# Patient Record
Sex: Male | Born: 1990 | Race: Black or African American | Hispanic: No | Marital: Single | Smoking: Current every day smoker
Health system: Southern US, Community
[De-identification: ages and names within clinical notes are randomized; demographics above are authoritative.]

## PROBLEM LIST (undated history)

## (undated) ENCOUNTER — Observation Stay (HOSPITAL_COMMUNITY): Admission: AD | Payer: Self-pay | Source: Intra-hospital | Admitting: Psychiatry

## (undated) ENCOUNTER — Emergency Department (HOSPITAL_COMMUNITY): Payer: Self-pay | Source: Home / Self Care

## (undated) DIAGNOSIS — I10 Essential (primary) hypertension: Secondary | ICD-10-CM

## (undated) DIAGNOSIS — J45909 Unspecified asthma, uncomplicated: Secondary | ICD-10-CM

---

## 2017-01-02 ENCOUNTER — Emergency Department (HOSPITAL_COMMUNITY)
Admission: EM | Admit: 2017-01-02 | Discharge: 2017-01-02 | Disposition: A | Discharge status: Home / Self Care | Payer: Self-pay | Attending: Emergency Medicine | Admitting: Emergency Medicine

## 2017-01-02 ENCOUNTER — Encounter (HOSPITAL_COMMUNITY): Payer: Self-pay

## 2017-01-02 DIAGNOSIS — K0889 Other specified disorders of teeth and supporting structures: Secondary | ICD-10-CM

## 2017-01-02 MED ORDER — DICLOFENAC SODIUM 50 MG PO TBEC: 50.0000 mg | DELAYED_RELEASE_TABLET | Freq: Two times a day (BID) | ORAL | 0 refills | Status: DC

## 2017-01-02 MED ORDER — AMOXICILLIN 500 MG PO CAPS: 500.0000 mg | ORAL_CAPSULE | Freq: Three times a day (TID) | ORAL | 0 refills | Status: DC

## 2017-01-02 NOTE — ED Triage Notes (Signed)
Patient complains of bottom left broken tooth and pain to gum and face.

## 2017-01-02 NOTE — ED Provider Notes (Signed)
[facility]-EMERGENCY DEPT Provider Note   CSN: [id number] Arrival date & time: 01/02/17  1219  By signing my name below, I, Majel HomerPeyton Lee, attest that this documentation has been prepared under the direction and in the presence of non-physician practitioner, Ok EdwardsLeslie Karen Sofia, PA-C. Electronically Signed: Majel HomerPeyton Lee, Scribe. 01/02/2017. 1:08 PM.  History   Chief Complaint No chief complaint on file.  The history is provided by the patient. No language interpreter was used.   HPI Comments: Bradley Reid is a 26 y.o. male who presents to the Emergency Department complaining of gradually worsening, left lower dental pain due to a "broken tooth" that began ~1 month ago. Pt reports associated gum and facial pain surrounding his broken tooth. He notes the last time he saw a dentist was "1-2 years ago." He denies any other complaints.   History reviewed. No pertinent past medical history.  There are no active problems to display for this patient.  History reviewed. No pertinent surgical history.  Home Medications    Prior to Admission medications   Not on File    Family History No family history on file.  Social History Social History  Substance Use Topics  . Smoking status: Not on file  . Smokeless tobacco: Not on file  . Alcohol use Not on file   Allergies   Patient has no known allergies.  Review of Systems Review of Systems  Constitutional: Negative for fever.  HENT: Positive for dental problem.    Physical Exam Updated Vital Signs BP 121/81   Pulse 62   Temp 98.2 F (36.8 C)   Resp 18   SpO2 100%   Physical Exam  Constitutional: He is oriented to person, place, and time. He appears well-developed and well-nourished.  HENT:  Head: Normocephalic.  Left lower premolar is broken, grayish discoloration with swollen gumline.   Eyes: EOM are normal.  Neck: Normal range of motion.  Pulmonary/Chest: Effort normal.  Abdominal: He exhibits no distension.  Musculoskeletal:  Normal range of motion.  Neurological: He is alert and oriented to person, place, and time.  Psychiatric: He has a normal mood and affect.  Nursing note and vitals reviewed.  ED Treatments / Results  DIAGNOSTIC STUDIES:  Oxygen Saturation is 100% on RA, normal by my interpretation.    COORDINATION OF CARE:  1:06 PM Discussed treatment plan with pt at bedside and pt agreed to plan.  Labs (all labs ordered are listed, but only abnormal results are displayed) Labs Reviewed - No data to display  EKG  EKG Interpretation None       Radiology No results found.  Procedures Procedures (including critical care time)  Medications Ordered in ED Medications - No data to display  Initial Impression / Assessment and Plan / ED Course  I have reviewed the triage vital signs and the nursing notes.  Pertinent labs & imaging results that were available during my care of the patient were reviewed by me and considered in my medical decision making (see chart for details).     Patient with dentalgia.  No abscess requiring immediate incision and drainage.  Exam not concerning for Ludwig's angina or pharyngeal abscess.  Will treat with Amoxicillin and Voltaren. Pt instructed to follow-up with Dr. Leanord AsalFarless.  Discussed return precautions. Pt safe for discharge.    Final Clinical Impressions(s) / ED Diagnoses   Final diagnoses:  Toothache    New Prescriptions Discharge Medication List as of 01/02/2017  1:13 PM    START taking these medications  Details  amoxicillin (AMOXIL) 500 MG capsule Take 1 capsule (500 mg total) by mouth 3 (three) times daily., Starting Mon 01/02/2017, Print    diclofenac (VOLTAREN) 50 MG EC tablet Take 1 tablet (50 mg total) by mouth 2 (two) times daily., Starting Mon 01/02/2017, Print       I personally performed the services in this documentation, which was scribed in my presence.  The recorded information has been reviewed and considered.   Barnet Pall. I  personally performed the services in this documentation, which was scribed in my presence.  The recorded information has been reviewed and considered.   Barnet Pall.   Lonia Skinner [employer], PA-C 01/02/17 1640    Benjiman Core, MD 01/02/17 2026

## 2017-02-27 ENCOUNTER — Emergency Department (HOSPITAL_COMMUNITY)
Admission: EM | Admit: 2017-02-27 | Discharge: 2017-02-27 | Disposition: A | Discharge status: Home / Self Care | Payer: Self-pay | Attending: Emergency Medicine | Admitting: Emergency Medicine

## 2017-02-27 DIAGNOSIS — R360 Urethral discharge without blood: Secondary | ICD-10-CM | POA: Insufficient documentation

## 2017-02-27 DIAGNOSIS — R369 Urethral discharge, unspecified: Secondary | ICD-10-CM

## 2017-02-27 LAB — URINALYSIS, ROUTINE W REFLEX MICROSCOPIC
BILIRUBIN URINE: NEGATIVE
Glucose, UA: NEGATIVE mg/dL
Ketones, ur: NEGATIVE mg/dL
Nitrite: NEGATIVE
Protein, ur: 30 mg/dL — AB
SPECIFIC GRAVITY, URINE: 1.006 (ref 1.005–1.030)
pH: 6 (ref 5.0–8.0)

## 2017-02-27 MED ORDER — CEFTRIAXONE SODIUM 250 MG IJ SOLR
250.0000 mg | Freq: Once | INTRAMUSCULAR | Status: AC
  Administered 2017-02-27: 250 mg via INTRAMUSCULAR
  Filled 2017-02-27: qty 250

## 2017-02-27 MED ORDER — AZITHROMYCIN 250 MG PO TABS
1000.0000 mg | ORAL_TABLET | Freq: Once | ORAL | Status: AC
  Administered 2017-02-27: 1000 mg via ORAL
  Filled 2017-02-27: qty 4

## 2017-02-27 NOTE — ED Triage Notes (Signed)
Pt states for the past 3 days he has had penile discharge and burning when he urinates. Pt also reports blood tinged discharge with ejaculation.

## 2017-02-27 NOTE — Discharge Instructions (Signed)
Please read attached information. If you experience any new or worsening signs or symptoms please return to the emergency room for evaluation. Please follow-up with your primary care provider or specialist as discussed.  °

## 2017-02-27 NOTE — ED Notes (Signed)
Pt is in stable condition upon d/c and ambulates from ED. 

## 2017-02-27 NOTE — ED Provider Notes (Signed)
[facility] Provider Note   CSN: [id number] Arrival date & time: 02/27/17  4098  By signing my name below, I, Modena Jansky, attest that this documentation has been prepared under the direction and in the presence of non-physician practitioner, Eyvonne Mechanic, PA-C. Electronically Signed: Modena Jansky, Scribe. 02/27/2017. 9:59 AM.  History   Chief Complaint Chief Complaint  Patient presents with  . Penile Discharge   The history is provided by the patient. No language interpreter was used.   HPI Comments: Bradley Reid is a 26 y.o. male who presents to the Emergency Department complaining of intermittent moderate penile discharge that started about 4 days ago. He states he has blood-containing discharge with ejaculation. He reports associated penile pain (only with erection) and dysuria. He admits to recent unprotected intercourse. Denies any prior hx of similar complaint, testicular pain/swelling, fever, vomiting, or other complaints at this time.  No past medical history on file.  There are no active problems to display for this patient.   No past surgical history on file.     Home Medications    Prior to Admission medications   Medication Sig Start Date End Date Taking? Authorizing Provider  amoxicillin (AMOXIL) 500 MG capsule Take 1 capsule (500 mg total) by mouth 3 (three) times daily. 01/02/17   Elson Areas, PA-C  diclofenac (VOLTAREN) 50 MG EC tablet Take 1 tablet (50 mg total) by mouth 2 (two) times daily. 01/02/17   Elson Areas, PA-C    Family History No family history on file.  Social History Social History  Substance Use Topics  . Smoking status: Not on file  . Smokeless tobacco: Not on file  . Alcohol use Not on file     Allergies   Patient has no known allergies.   Review of Systems Review of Systems  Constitutional: Negative for fever.  Gastrointestinal: Negative for vomiting.  Genitourinary: Positive for discharge, dysuria and penile  pain (with erection). Negative for scrotal swelling and testicular pain.     Physical Exam Updated Vital Signs BP (!) 137/102 (BP Location: Left Arm)   Pulse 71   Temp 98.2 F (36.8 C) (Oral)   Resp 20   SpO2 100%   Physical Exam  Constitutional: He appears well-developed and well-nourished. No distress.  HENT:  Head: Normocephalic and atraumatic.  Eyes: Conjunctivae are normal.  Neck: Neck supple.  Cardiovascular: Normal rate.   Pulmonary/Chest: Effort normal.  Abdominal: Soft. There is no tenderness.  Musculoskeletal: Normal range of motion.  Neurological: He is alert.  Skin: Skin is warm and dry.  Psychiatric: He has a normal mood and affect.  Nursing note and vitals reviewed.    ED Treatments / Results  DIAGNOSTIC STUDIES: Oxygen Saturation is 100% on RA, normal by my interpretation.    COORDINATION OF CARE: 10:03 AM- Pt advised of plan for treatment and pt agrees.  Labs (all labs ordered are listed, but only abnormal results are displayed) Labs Reviewed  URINALYSIS, ROUTINE W REFLEX MICROSCOPIC - Abnormal; Notable for the following:       Result Value   APPearance CLOUDY (*)    Hgb urine dipstick MODERATE (*)    Protein, ur 30 (*)    Leukocytes, UA LARGE (*)    Bacteria, UA RARE (*)    Squamous Epithelial / LPF 0-5 (*)    Non Squamous Epithelial 0-5 (*)    All other components within normal limits  GC/CHLAMYDIA PROBE AMP ([place]) NOT AT [facility]  EKG  EKG Interpretation None       Radiology No results found.  Procedures Procedures (including critical care time)  Medications Ordered in ED Medications  cefTRIAXone (ROCEPHIN) injection 250 mg (250 mg Intramuscular Given 02/27/17 1040)  azithromycin (ZITHROMAX) tablet 1,000 mg (1,000 mg Oral Given 02/27/17 1040)     Initial Impression / Assessment and Plan / ED Course  I have reviewed the triage vital signs and the nursing notes.  Pertinent labs & imaging results that were available during  my care of the patient were reviewed by me and considered in my medical decision making (see chart for details).       Final Clinical Impressions(s) / ED Diagnoses   Final diagnoses:  Penile discharge   Labs: Urinalysis, GC  Therapeutics: Azithromycin, ceftriaxone  Assessment/Plan: 26 year old male presents today with likely STD.  Patient afebrile nontoxic no acute distress.  Prophylactic treatment given after discussion of risks and benefits.  Patient encouraged to inform all sexual partners of results when they become available.  Encouraged follow-up with the health department for reassessment if symptoms persist.      New Prescriptions Discharge Medication List as of 02/27/2017 10:31 AM     I personally performed the services described in this documentation, which was scribed in my presence. The recorded information has been reviewed and is accurate.    Eyvonne Mechanic, PA-C 02/27/17 [address] PA-C 02/27/17 1121    Arby Barrette, MD 02/28/17 [phone]

## 2017-03-22 ENCOUNTER — Encounter (HOSPITAL_COMMUNITY): Payer: Self-pay | Admitting: Emergency Medicine

## 2017-03-22 ENCOUNTER — Emergency Department (HOSPITAL_COMMUNITY)
Admission: EM | Admit: 2017-03-22 | Discharge: 2017-03-22 | Disposition: A | Discharge status: Home / Self Care | Payer: Self-pay | Attending: Emergency Medicine | Admitting: Emergency Medicine

## 2017-03-22 DIAGNOSIS — R21 Rash and other nonspecific skin eruption: Secondary | ICD-10-CM

## 2017-03-22 DIAGNOSIS — L2082 Flexural eczema: Secondary | ICD-10-CM | POA: Insufficient documentation

## 2017-03-22 MED ORDER — TRIAMCINOLONE ACETONIDE 0.1 % EX CREA: 1.0000 "application " | TOPICAL_CREAM | Freq: Two times a day (BID) | CUTANEOUS | 0 refills | Status: DC

## 2017-03-22 MED ORDER — CEPHALEXIN 500 MG PO CAPS: 1000.0000 mg | ORAL_CAPSULE | Freq: Two times a day (BID) | ORAL | 0 refills | Status: DC

## 2017-03-22 NOTE — ED Provider Notes (Signed)
[facility] Provider Note   CSN: [id number] Arrival date & time: 03/22/17  1617  By signing my name below, I, Teofilo Pod, attest that this documentation has been prepared under the direction and in the presence of [employer], PA-C. Electronically Signed: Teofilo Pod, ED Scribe. 03/22/2017. 4:46 PM.   History   Chief Complaint Chief Complaint  Patient presents with  . Rash   The history is provided by the patient. No language interpreter was used.   HPI Comments:  Bradley Reid is a 26 y.o. male who presents to the Emergency Department complaining of a worsening generalized rash for several days. Pt notes several rash areas on his abdomen, left flank, and left leg. He reports associated drainage. He states that the rash is similar to previous outbreaks of eczema. He has used prednisone cream with no relief. Pt denies other associated symptoms.   History reviewed. No pertinent past medical history.  There are no active problems to display for this patient.   No past surgical history on file.     Home Medications    Prior to Admission medications   Medication Sig Start Date End Date Taking? Authorizing Provider  amoxicillin (AMOXIL) 500 MG capsule Take 1 capsule (500 mg total) by mouth 3 (three) times daily. 01/02/17   Elson Areas, PA-C  diclofenac (VOLTAREN) 50 MG EC tablet Take 1 tablet (50 mg total) by mouth 2 (two) times daily. 01/02/17   Elson Areas, PA-C    Family History No family history on file.  Social History Social History  Substance Use Topics  . Smoking status: Not on file  . Smokeless tobacco: Not on file  . Alcohol use Not on file     Allergies   Patient has no known allergies.   Review of Systems Review of Systems All other systems negative except as documented in the HPI. All pertinent positives and negatives as reviewed in the HPI.   Physical Exam Updated Vital Signs BP 129/60 (BP Location: Right Arm)    Pulse 60   Temp 98 F (36.7 C) (Oral)   Resp 16   SpO2 100%   Physical Exam  Constitutional: He appears well-developed and well-nourished. No distress.  HENT:  Head: Normocephalic and atraumatic.  Eyes: Conjunctivae are normal.  Cardiovascular: Normal rate.   Pulmonary/Chest: Effort normal.  Abdominal: He exhibits no distension.  Neurological: He is alert.  Skin: Skin is warm and dry.  Scaly raised rash on posterior aspects of bilateral knees, medial left thigh, and left flank. No sign of infection, redness, or warmth  Psychiatric: He has a normal mood and affect.  Nursing note and vitals reviewed.    ED Treatments / Results  DIAGNOSTIC STUDIES:  Oxygen Saturation is 100% on RA, normal by my interpretation.    COORDINATION OF CARE:  4:45 PM Will prescribe antibiotics.  Discussed treatment plan with pt at bedside and pt agreed to plan.   Labs (all labs ordered are listed, but only abnormal results are displayed) Labs Reviewed - No data to display  EKG  EKG Interpretation None       Radiology No results found.  Procedures Procedures (including critical care time)  Medications Ordered in ED Medications - No data to display   Initial Impression / Assessment and Plan / ED Course  I have reviewed the triage vital signs and the nursing notes.  Pertinent labs & imaging results that were available during my care of the patient were reviewed  by me and considered in my medical decision making (see chart for details).     Patient be treated for eczema type rash.  Told to return here as needed.  Patient agrees the plan and all questions were answered  Final Clinical Impressions(s) / ED Diagnoses   Final diagnoses:  None    New Prescriptions New Prescriptions   No medications on file  I personally performed the services described in this documentation, which was scribed in my presence. The recorded information has been reviewed and is accurate.    Charlestine NightLawyer,  Georgetta Crafton, PA-C 03/24/17 0126    Gerhard MunchLockwood, Robert, MD 03/25/17 [phone]

## 2017-03-22 NOTE — Discharge Instructions (Signed)
Return here as needed. °

## 2017-03-22 NOTE — ED Triage Notes (Signed)
Pt has eczema on his side and left leg. Pt states he is out of cream that he usually uses to treat it.

## 2017-04-21 ENCOUNTER — Encounter (HOSPITAL_COMMUNITY): Payer: Self-pay | Admitting: Emergency Medicine

## 2017-04-21 ENCOUNTER — Emergency Department (HOSPITAL_COMMUNITY)
Admission: EM | Admit: 2017-04-21 | Discharge: 2017-04-22 | Payer: Self-pay | Attending: Emergency Medicine | Admitting: Emergency Medicine

## 2017-04-21 DIAGNOSIS — T50904A Poisoning by unspecified drugs, medicaments and biological substances, undetermined, initial encounter: Secondary | ICD-10-CM | POA: Insufficient documentation

## 2017-04-21 DIAGNOSIS — Z036 Encounter for observation for suspected toxic effect from ingested substance ruled out: Secondary | ICD-10-CM | POA: Insufficient documentation

## 2017-04-21 DIAGNOSIS — Z79899 Other long term (current) drug therapy: Secondary | ICD-10-CM | POA: Insufficient documentation

## 2017-04-21 LAB — I-STAT CHEM 8, ED
BUN: 12 mg/dL (ref 6–20)
CREATININE: 1.3 mg/dL — AB (ref 0.61–1.24)
Calcium, Ion: 1.19 mmol/L (ref 1.15–1.40)
Chloride: 103 mmol/L (ref 101–111)
GLUCOSE: 93 mg/dL (ref 65–99)
HEMATOCRIT: 42 % (ref 39.0–52.0)
HEMOGLOBIN: 14.3 g/dL (ref 13.0–17.0)
POTASSIUM: 3.8 mmol/L (ref 3.5–5.1)
Sodium: 141 mmol/L (ref 135–145)
TCO2: 28 mmol/L (ref 0–100)

## 2017-04-21 LAB — CBC WITH DIFFERENTIAL/PLATELET
BASOS ABS: 0 10*3/uL (ref 0.0–0.1)
Basophils Relative: 0 %
Eosinophils Absolute: 0.1 10*3/uL (ref 0.0–0.7)
Eosinophils Relative: 1 %
HCT: 40.3 % (ref 39.0–52.0)
HEMOGLOBIN: 13.5 g/dL (ref 13.0–17.0)
LYMPHS ABS: 1.1 10*3/uL (ref 0.7–4.0)
LYMPHS PCT: 14 %
MCH: 30.8 pg (ref 26.0–34.0)
MCHC: 33.5 g/dL (ref 30.0–36.0)
MCV: 91.8 fL (ref 78.0–100.0)
Monocytes Absolute: 0.6 10*3/uL (ref 0.1–1.0)
Monocytes Relative: 7 %
NEUTROS ABS: 6.5 10*3/uL (ref 1.7–7.7)
NEUTROS PCT: 78 %
Platelets: 189 10*3/uL (ref 150–400)
RBC: 4.39 MIL/uL (ref 4.22–5.81)
RDW: 12.9 % (ref 11.5–15.5)
WBC: 8.3 10*3/uL (ref 4.0–10.5)

## 2017-04-21 LAB — RAPID URINE DRUG SCREEN, HOSP PERFORMED
AMPHETAMINES: NOT DETECTED
BARBITURATES: NOT DETECTED
BENZODIAZEPINES: NOT DETECTED
COCAINE: POSITIVE — AB
Opiates: NOT DETECTED
Tetrahydrocannabinol: POSITIVE — AB

## 2017-04-21 LAB — I-STAT CG4 LACTIC ACID, ED: Lactic Acid, Venous: 0.93 mmol/L (ref 0.5–1.9)

## 2017-04-21 LAB — BASIC METABOLIC PANEL
ANION GAP: 7 (ref 5–15)
BUN: 12 mg/dL (ref 6–20)
CHLORIDE: 106 mmol/L (ref 101–111)
CO2: 27 mmol/L (ref 22–32)
Calcium: 9 mg/dL (ref 8.9–10.3)
Creatinine, Ser: 1.32 mg/dL — ABNORMAL HIGH (ref 0.61–1.24)
GFR calc Af Amer: >60 mL/min (ref >60)
GFR calc non Af Amer: >60 mL/min (ref >60)
GLUCOSE: 101 mg/dL — AB (ref 65–99)
POTASSIUM: 3.8 mmol/L (ref 3.5–5.1)
Sodium: 140 mmol/L (ref 135–145)

## 2017-04-21 LAB — I-STAT TROPONIN, ED: Troponin i, poc: 0 ng/mL (ref 0.00–0.08)

## 2017-04-21 LAB — SALICYLATE LEVEL: Salicylate Lvl: <7 mg/dL (ref 2.8–30.0)

## 2017-04-21 LAB — ACETAMINOPHEN LEVEL: Acetaminophen (Tylenol), Serum: <10 ug/mL — ABNORMAL LOW (ref 10–30)

## 2017-04-21 LAB — ETHANOL

## 2017-04-21 NOTE — ED Notes (Signed)
[employer] at bedside 

## 2017-04-21 NOTE — ED Provider Notes (Signed)
[facility]-EMERGENCY DEPT Provider Note   CSN: [id number] Arrival date & time: 04/21/17  1912     History   Chief Complaint Chief Complaint  Patient presents with  . Ingestion    HPI Bradley Reid is a 26 y.o. male.  HPI  26 year old male with a history of cocaine abuse presents to the ED by EMS for possible ingestion of unknown amount of cocaine. [facility] to [facility] a [facility] fraudulently. After being detained patient was seen placing something in his mouth, but they were unable to determine what the substance was noted. Patient denies using COCAINE on. States that he put a Chief of StaffJolly rancher in his mouth. No alleviating or aggravating factors. Currently denies any physical complaints.   History reviewed. No pertinent past medical history.  There are no active problems to display for this patient.   History reviewed. No pertinent surgical history.     Home Medications    Prior to Admission medications   Medication Sig Start Date End Date Taking? Authorizing Provider  amoxicillin (AMOXIL) 500 MG capsule Take 1 capsule (500 mg total) by mouth 3 (three) times daily. 01/02/17   Elson AreasSofia, Leslie K, PA-C  cephALEXin (KEFLEX) 500 MG capsule Take 2 capsules (1,000 mg total) by mouth 2 (two) times daily. 03/22/17   Lawyer, Cristal Deerhristopher, PA-C  diclofenac (VOLTAREN) 50 MG EC tablet Take 1 tablet (50 mg total) by mouth 2 (two) times daily. 01/02/17   Elson AreasSofia, Leslie K, PA-C  triamcinolone cream (KENALOG) 0.1 % Apply 1 application topically 2 (two) times daily. 03/22/17   Lawyer, Cristal Deerhristopher, PA-C    Family History No family history on file.  Social History Social History  Substance Use Topics  . Smoking status: Not on file  . Smokeless tobacco: Not on file  . Alcohol use Not on file     Allergies   Patient has no known allergies.   Review of Systems Review of Systems All other systems are reviewed and are negative for acute change except as noted in the  HPI   Physical Exam Updated Vital Signs BP 99/79 (BP Location: Right Arm)   Pulse 84   Temp 98 F (36.7 C) (Oral)   Resp 18   SpO2 100%   Physical Exam  Constitutional: He is oriented to person, place, and time. He appears well-developed and well-nourished. He is cooperative. No distress.  Calm  HENT:  Head: Normocephalic and atraumatic.  Nose: Nose normal.  Eyes: Conjunctivae and EOM are normal. Pupils are equal, round, and reactive to light. Right eye exhibits no discharge. Left eye exhibits no discharge. No scleral icterus.  Neck: Normal range of motion. Neck supple.  Cardiovascular: Normal rate and regular rhythm.  Exam reveals no gallop and no friction rub.   No murmur heard. Pulmonary/Chest: Effort normal and breath sounds normal. No stridor. No respiratory distress. He has no rales.  Abdominal: Soft. He exhibits no distension. There is no tenderness.  Musculoskeletal: He exhibits no edema or tenderness.  Neurological: He is alert and oriented to person, place, and time.  Skin: Skin is warm and dry. No rash noted. He is not diaphoretic. No erythema.  Psychiatric: He has a normal mood and affect.  Vitals reviewed.    ED Treatments / Results  Labs (all labs ordered are listed, but only abnormal results are displayed) Labs Reviewed  BASIC METABOLIC PANEL - Abnormal; Notable for the following:       Result Value   Glucose, Bld 101 (*)  Creatinine, Ser 1.32 (*)    All other components within normal limits  RAPID URINE DRUG SCREEN, HOSP PERFORMED - Abnormal; Notable for the following:    Cocaine POSITIVE (*)    Tetrahydrocannabinol POSITIVE (*)    All other components within normal limits  ACETAMINOPHEN LEVEL - Abnormal; Notable for the following:    Acetaminophen (Tylenol), Serum <10 (*)    All other components within normal limits  I-STAT CHEM 8, ED - Abnormal; Notable for the following:    Creatinine, Ser 1.30 (*)    All other components within normal limits   CBC WITH DIFFERENTIAL/PLATELET  ETHANOL  SALICYLATE LEVEL  I-STAT CG4 LACTIC ACID, ED  I-STAT TROPOININ, ED    EKG  EKG Interpretation None       Radiology No results found.  Procedures Procedures (including critical care time)  Medications Ordered in ED Medications - No data to display   Initial Impression / Assessment and Plan / ED Course  I have reviewed the triage vital signs and the nursing notes.  Pertinent labs & imaging results that were available during my care of the patient were reviewed by me and considered in my medical decision making (see chart for details).     Possible cocaine ingestion. Screening labs and congestion labs reassuring. Patient monitored for 4 hours with no change in mental status or vital signs. Had the patient ingested cocaine we will likely seen tachycardia and change in his vital signs. At this point I feel that the patient is medically cleared for discharge. He will be released to [employer] custody.  Final Clinical Impressions(s) / ED Diagnoses   Final diagnoses:  Ingestion of unknown drug, undetermined intent, initial encounter   Disposition: Discharge  Condition: Good  I have discussed the results, Dx and Tx plan with the patient who expressed understanding and agree(s) with the plan. Discharge instructions discussed at great length. The patient was given strict return precautions who verbalized understanding of the instructions. No further questions at time of discharge.    New Prescriptions   No medications on file    Follow Up: Primary care Dr.  Call  As needed      Nira Conn, MD 04/21/17 2332

## 2017-04-21 NOTE — ED Notes (Signed)
Pt informed urine sample is needed. 

## 2017-04-21 NOTE — ED Notes (Signed)
Poison Control contacted for advice on pt care. Spoke to [place]. Advised to monitor pt vital signs for at least 8 hours to ensure no major changes are noted. Advised that if pt vitals and LOC remain normal, pt may be monitored for shorter amount of time.

## 2017-04-21 NOTE — ED Triage Notes (Signed)
Per EMS pt was being placed in police custody after being [facility] [employer] and while being placed in custody he swallowed a baggy of cocaine and admits to smoking weed earlier today. Pt A&O x 4 at this time.   132/98 HR 108 98% RA Resp 20

## 2017-04-21 NOTE — ED Notes (Signed)
Bed: RESB Expected date:  Expected time:  Means of arrival:  Comments: 25yo M Ingestion/cocaine?

## 2018-01-22 ENCOUNTER — Other Ambulatory Visit: Payer: Self-pay

## 2018-01-22 ENCOUNTER — Encounter (HOSPITAL_COMMUNITY): Payer: Self-pay

## 2018-01-22 ENCOUNTER — Emergency Department (HOSPITAL_COMMUNITY)
Admission: EM | Admit: 2018-01-22 | Discharge: 2018-01-22 | Disposition: A | Discharge status: Home / Self Care | Payer: Self-pay | Attending: Emergency Medicine | Admitting: Emergency Medicine

## 2018-01-22 ENCOUNTER — Emergency Department (HOSPITAL_COMMUNITY): Payer: Self-pay

## 2018-01-22 DIAGNOSIS — J45909 Unspecified asthma, uncomplicated: Secondary | ICD-10-CM

## 2018-01-22 DIAGNOSIS — J4521 Mild intermittent asthma with (acute) exacerbation: Secondary | ICD-10-CM | POA: Insufficient documentation

## 2018-01-22 DIAGNOSIS — I10 Essential (primary) hypertension: Secondary | ICD-10-CM | POA: Insufficient documentation

## 2018-01-22 DIAGNOSIS — R0602 Shortness of breath: Secondary | ICD-10-CM

## 2018-01-22 HISTORY — DX: Essential (primary) hypertension: I10

## 2018-01-22 MED ORDER — ALBUTEROL SULFATE HFA 108 (90 BASE) MCG/ACT IN AERS
2.0000 | INHALATION_SPRAY | RESPIRATORY_TRACT | Status: DC
  Administered 2018-01-22: 2 via RESPIRATORY_TRACT
  Filled 2018-01-22: qty 6.7

## 2018-01-22 NOTE — ED Triage Notes (Signed)
Pt presents to the ed with complaints of shortness of breath x 45 minutes.  In nad in triage. Pt arrives in police custody and in handcuffs. States the name he gave us and the police is not correct. Will get changed to the correct name he is now giving us.
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
## 2018-01-23 ENCOUNTER — Encounter (HOSPITAL_COMMUNITY): Payer: Self-pay | Admitting: Emergency Medicine

## 2018-03-28 ENCOUNTER — Emergency Department (HOSPITAL_COMMUNITY): Payer: No Typology Code available for payment source

## 2018-03-28 ENCOUNTER — Encounter (HOSPITAL_COMMUNITY): Payer: Self-pay | Admitting: Emergency Medicine

## 2018-03-28 ENCOUNTER — Emergency Department (HOSPITAL_COMMUNITY)
Admission: EM | Admit: 2018-03-28 | Discharge: 2018-03-28 | Disposition: A | Discharge status: Home / Self Care | Payer: No Typology Code available for payment source | Attending: Emergency Medicine | Admitting: Emergency Medicine

## 2018-03-28 DIAGNOSIS — S8992XA Unspecified injury of left lower leg, initial encounter: Secondary | ICD-10-CM | POA: Diagnosis present

## 2018-03-28 DIAGNOSIS — R1084 Generalized abdominal pain: Secondary | ICD-10-CM | POA: Insufficient documentation

## 2018-03-28 DIAGNOSIS — Y929 Unspecified place or not applicable: Secondary | ICD-10-CM | POA: Insufficient documentation

## 2018-03-28 DIAGNOSIS — S80812A Abrasion, left lower leg, initial encounter: Secondary | ICD-10-CM | POA: Diagnosis not present

## 2018-03-28 DIAGNOSIS — F1910 Other psychoactive substance abuse, uncomplicated: Secondary | ICD-10-CM

## 2018-03-28 DIAGNOSIS — Y9389 Activity, other specified: Secondary | ICD-10-CM | POA: Diagnosis not present

## 2018-03-28 DIAGNOSIS — S80811A Abrasion, right lower leg, initial encounter: Secondary | ICD-10-CM | POA: Insufficient documentation

## 2018-03-28 DIAGNOSIS — Y999 Unspecified external cause status: Secondary | ICD-10-CM | POA: Insufficient documentation

## 2018-03-28 DIAGNOSIS — R4182 Altered mental status, unspecified: Secondary | ICD-10-CM | POA: Insufficient documentation

## 2018-03-28 LAB — I-STAT CHEM 8, ED
BUN: 8 mg/dL (ref 6–20)
CREATININE: 1.5 mg/dL — AB (ref 0.61–1.24)
Calcium, Ion: 1.14 mmol/L — ABNORMAL LOW (ref 1.15–1.40)
Chloride: 103 mmol/L (ref 101–111)
Glucose, Bld: 83 mg/dL (ref 65–99)
HEMATOCRIT: 46 % (ref 39.0–52.0)
HEMOGLOBIN: 15.6 g/dL (ref 13.0–17.0)
Potassium: 4.6 mmol/L (ref 3.5–5.1)
Sodium: 140 mmol/L (ref 135–145)
TCO2: 27 mmol/L (ref 22–32)

## 2018-03-28 LAB — COMPREHENSIVE METABOLIC PANEL
ALBUMIN: 4 g/dL (ref 3.5–5.0)
ALK PHOS: 74 U/L (ref 38–126)
ALT: 17 U/L (ref 17–63)
AST: 27 U/L (ref 15–41)
Anion gap: 9 (ref 5–15)
BUN: 9 mg/dL (ref 6–20)
CHLORIDE: 104 mmol/L (ref 101–111)
CO2: 29 mmol/L (ref 22–32)
CREATININE: 1.61 mg/dL — AB (ref 0.61–1.24)
Calcium: 9.4 mg/dL (ref 8.9–10.3)
GFR calc non Af Amer: 57 mL/min — ABNORMAL LOW (ref >60)
GLUCOSE: 89 mg/dL (ref 65–99)
Potassium: 4.7 mmol/L (ref 3.5–5.1)
Sodium: 142 mmol/L (ref 135–145)
Total Bilirubin: 0.7 mg/dL (ref 0.3–1.2)
Total Protein: 7.1 g/dL (ref 6.5–8.1)

## 2018-03-28 LAB — CBC WITH DIFFERENTIAL/PLATELET
Basophils Absolute: 0 10*3/uL (ref 0.0–0.1)
Basophils Relative: 0 %
Eosinophils Absolute: 0.1 10*3/uL (ref 0.0–0.7)
Eosinophils Relative: 1 %
HEMATOCRIT: 45.7 % (ref 39.0–52.0)
Hemoglobin: 15 g/dL (ref 13.0–17.0)
LYMPHS ABS: 1.3 10*3/uL (ref 0.7–4.0)
Lymphocytes Relative: 9 %
MCH: 30.5 pg (ref 26.0–34.0)
MCHC: 32.8 g/dL (ref 30.0–36.0)
MCV: 92.9 fL (ref 78.0–100.0)
MONOS PCT: 5 %
Monocytes Absolute: 0.7 10*3/uL (ref 0.1–1.0)
Neutro Abs: 11.9 10*3/uL — ABNORMAL HIGH (ref 1.7–7.7)
Neutrophils Relative %: 85 %
PLATELETS: 239 10*3/uL (ref 150–400)
RBC: 4.92 MIL/uL (ref 4.22–5.81)
RDW: 11.9 % (ref 11.5–15.5)
WBC: 14 10*3/uL — AB (ref 4.0–10.5)

## 2018-03-28 LAB — ETHANOL: Alcohol, Ethyl (B): <10 mg/dL (ref <10)

## 2018-03-28 MED ORDER — SODIUM CHLORIDE 0.9 % IV BOLUS: 500.0000 mL | Freq: Once | INTRAVENOUS | Status: DC

## 2018-03-28 NOTE — ED Notes (Signed)
Pt refusing IV at this time, stating "no more needles, I don't want that"
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
## 2018-03-28 NOTE — ED Notes (Signed)
9 thorns pulled out of the bottom of pt's feet. Pt states it feels like there are none left and he would be able to walk if needed, pt still very lethargic but does respond to calling
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
## 2018-03-28 NOTE — Discharge Instructions (Addendum)
You had some thorns removed from your feet today. You can soak your feet in warm water 3 to 4 times daily. Get rechecked if you have any increased pain or drainage from your feet. Get reject if you have any new or concerning symptoms.

## 2018-03-28 NOTE — ED Notes (Signed)
Pt ambulatory in hallway with assistance from staff; walking stiffly and hesitant to walk at first. Assistance and encouragement needed. Pt returned to stretcher and provided with [place] sandwich and ginger ale. Pt eating and drinking without any assistance

## 2018-03-28 NOTE — ED Notes (Signed)
Pt not able to provide urine sample 

## 2018-04-02 ENCOUNTER — Encounter (HOSPITAL_COMMUNITY): Payer: Self-pay | Admitting: Emergency Medicine

## 2019-06-25 ENCOUNTER — Other Ambulatory Visit: Payer: Self-pay

## 2019-06-25 ENCOUNTER — Emergency Department (HOSPITAL_COMMUNITY): Payer: Self-pay

## 2019-06-25 ENCOUNTER — Emergency Department (HOSPITAL_COMMUNITY)
Admission: EM | Admit: 2019-06-25 | Discharge: 2019-06-26 | Disposition: A | Discharge status: Home / Self Care | Payer: Self-pay | Attending: Emergency Medicine | Admitting: Emergency Medicine

## 2019-06-25 ENCOUNTER — Encounter (HOSPITAL_COMMUNITY): Payer: Self-pay | Admitting: *Deleted

## 2019-06-25 DIAGNOSIS — F17200 Nicotine dependence, unspecified, uncomplicated: Secondary | ICD-10-CM | POA: Insufficient documentation

## 2019-06-25 DIAGNOSIS — I10 Essential (primary) hypertension: Secondary | ICD-10-CM | POA: Insufficient documentation

## 2019-06-25 DIAGNOSIS — Z20828 Contact with and (suspected) exposure to other viral communicable diseases: Secondary | ICD-10-CM | POA: Insufficient documentation

## 2019-06-25 DIAGNOSIS — Z79899 Other long term (current) drug therapy: Secondary | ICD-10-CM | POA: Insufficient documentation

## 2019-06-25 DIAGNOSIS — J069 Acute upper respiratory infection, unspecified: Secondary | ICD-10-CM | POA: Insufficient documentation

## 2019-06-25 DIAGNOSIS — J4521 Mild intermittent asthma with (acute) exacerbation: Secondary | ICD-10-CM | POA: Insufficient documentation

## 2019-06-25 DIAGNOSIS — R0602 Shortness of breath: Secondary | ICD-10-CM | POA: Insufficient documentation

## 2019-06-25 HISTORY — DX: Unspecified asthma, uncomplicated: J45.909

## 2019-06-25 LAB — CBC WITH DIFFERENTIAL/PLATELET
Abs Immature Granulocytes: 0.03 10*3/uL (ref 0.00–0.07)
Basophils Absolute: 0 10*3/uL (ref 0.0–0.1)
Basophils Relative: 0 %
Eosinophils Absolute: 0.2 10*3/uL (ref 0.0–0.5)
Eosinophils Relative: 2 %
HCT: 45.6 % (ref 39.0–52.0)
Hemoglobin: 14.8 g/dL (ref 13.0–17.0)
Immature Granulocytes: 0 %
Lymphocytes Relative: 8 %
Lymphs Abs: 1 10*3/uL (ref 0.7–4.0)
MCH: 31.5 pg (ref 26.0–34.0)
MCHC: 32.5 g/dL (ref 30.0–36.0)
MCV: 97 fL (ref 80.0–100.0)
Monocytes Absolute: 0.7 10*3/uL (ref 0.1–1.0)
Monocytes Relative: 5 %
Neutro Abs: 10.6 10*3/uL — ABNORMAL HIGH (ref 1.7–7.7)
Neutrophils Relative %: 85 %
Platelets: 181 10*3/uL (ref 150–400)
RBC: 4.7 MIL/uL (ref 4.22–5.81)
RDW: 12.5 % (ref 11.5–15.5)
WBC: 12.6 10*3/uL — ABNORMAL HIGH (ref 4.0–10.5)
nRBC: 0 % (ref 0.0–0.2)

## 2019-06-25 MED ORDER — ALBUTEROL SULFATE HFA 108 (90 BASE) MCG/ACT IN AERS
6.0000 | INHALATION_SPRAY | Freq: Once | RESPIRATORY_TRACT | Status: AC
  Administered 2019-06-25: 23:00:00 6 via RESPIRATORY_TRACT
  Filled 2019-06-25: qty 6.7

## 2019-06-25 NOTE — ED Provider Notes (Addendum)
[facility]   CSN: [id number] Arrival date & time: 06/25/19  2142    History   Chief Complaint Chief Complaint  Patient presents with  . Shortness of Breath    HPI Bradley Reid is a 28 y.o. male.     Patient presents to the emergency department for evaluation of cough and shortness of breath.  Symptoms started yesterday, worsened today.  He does have a history of asthma, does not currently have an inhaler.  He has not documented any fevers.  He denies any known COVID-19 contacts, but does work outside the house.     Past Medical History:  Diagnosis Date  . Asthma   . Hypertension     There are no active problems to display for this patient.   History reviewed. No pertinent surgical history.      Home Medications    Prior to Admission medications   Medication Sig Start Date End Date Taking? Authorizing Provider  albuterol (VENTOLIN HFA) 108 (90 Base) MCG/ACT inhaler Inhale 2 puffs into the lungs every 4 (four) hours as needed for wheezing or shortness of breath. 06/26/19   Bradley Reid, Bradley Lampkins J, MD  amoxicillin (AMOXIL) 500 MG capsule Take 1 capsule (500 mg total) by mouth 3 (three) times daily. 01/02/17   Bradley Reid, Bradley K, Reid  cephALEXin (KEFLEX) 500 MG capsule Take 2 capsules (1,000 mg total) by mouth 2 (two) times daily. 03/22/17   Bradley, Bradley Deerhristopher, Reid  diclofenac (VOLTAREN) 50 MG EC tablet Take 1 tablet (50 mg total) by mouth 2 (two) times daily. 01/02/17   Bradley Reid, Bradley K, Reid  predniSONE (DELTASONE) 20 MG tablet Take 2 tablets (40 mg total) by mouth daily with breakfast. 06/26/19   Bradley Reid, Bradley Brimhristopher J, MD  triamcinolone cream (KENALOG) 0.1 % Apply 1 application topically 2 (two) times daily. 03/22/17   Bradley, Bradley Deerhristopher, Reid    Family History No family history on file.  Social History Social History   Tobacco Use  . Smoking status: Current Every Day Smoker  Substance Use Topics  . Alcohol use:  Yes  . Drug use: Never     Allergies   Patient has no known allergies.   Review of Systems Review of Systems  Respiratory: Positive for cough and shortness of breath.   All other systems reviewed and are negative.    Physical Exam Updated Vital Signs BP 124/82   Pulse (!) 109   Temp 99.3 F (37.4 C)   Resp (!) 25   Ht 5\' 11"  (1.803 m)   Wt 74.8 kg   SpO2 90%   BMI 23.01 kg/m   Physical Exam Vitals signs and nursing note reviewed.  Constitutional:      General: He is not in acute distress.    Appearance: Normal appearance. He is well-developed.  HENT:     Head: Normocephalic and atraumatic.     Right Ear: Hearing normal.     Left Ear: Hearing normal.     Nose: Nose normal.  Eyes:     Conjunctiva/sclera: Conjunctivae normal.     Pupils: Pupils are equal, round, and reactive to light.  Neck:     Musculoskeletal: Normal range of motion and neck supple.  Cardiovascular:     Rate and Rhythm: Regular rhythm.     Heart sounds: S1 normal and S2 normal. No murmur. No friction rub. No gallop.   Pulmonary:     Effort: Pulmonary effort is normal. No respiratory distress.  Breath sounds: Normal breath sounds.  Chest:     Chest wall: No tenderness.  Abdominal:     General: Bowel sounds are normal.     Palpations: Abdomen is soft.     Tenderness: There is no abdominal tenderness. There is no guarding or rebound. Negative signs include Murphy's sign and McBurney's sign.     Hernia: No hernia is present.  Musculoskeletal: Normal range of motion.  Skin:    General: Skin is warm and dry.     Findings: No rash.  Neurological:     Mental Status: He is alert and oriented to person, place, and time.     GCS: GCS eye subscore is 4. GCS verbal subscore is 5. GCS motor subscore is 6.     Cranial Nerves: No cranial nerve deficit.     Sensory: No sensory deficit.     Coordination: Coordination normal.  Psychiatric:        Speech: Speech normal.        Behavior: Behavior  normal.        Thought Content: Thought content normal.      ED Treatments / Results  Labs (all labs ordered are listed, but only abnormal results are displayed) Labs Reviewed  CBC WITH DIFFERENTIAL/PLATELET - Abnormal; Notable for the following components:      Result Value   WBC 12.6 (*)    Neutro Abs 10.6 (*)    All other components within normal limits  COMPREHENSIVE METABOLIC PANEL - Abnormal; Notable for the following components:   Creatinine, Ser 1.47 (*)    All other components within normal limits  NOVEL CORONAVIRUS, NAA (HOSPITAL ORDER, SEND-OUT TO REF LAB)  MAGNESIUM  TSH  RAPID URINE DRUG SCREEN, HOSP PERFORMED  TROPONIN I (HIGH SENSITIVITY)    EKG EKG Interpretation  Date/Time:  Tuesday June 25 2019 22:42:59 EDT Ventricular Rate:  83 PR Interval:    QRS Duration: 91 QT Interval:  336 QTC Calculation: 395 R Axis:   61 Text Interpretation:  Sinus rhythm Multiform ventricular premature complexes No STEMI  Confirmed by Bradley Reid, Bradley Reid [phone]([id number]) on 06/25/2019 10:48:03 PM   Radiology Dg Chest Port 1 View  Result Date: 06/25/2019 CLINICAL DATA:  28 year old male with shortness of breath. EXAM: PORTABLE CHEST 1 VIEW COMPARISON:  Chest radiograph dated 03/28/2018 FINDINGS: The heart size and mediastinal contours are within normal limits. Both lungs are clear. The visualized skeletal structures are unremarkable. IMPRESSION: No active disease. Electronically Signed   By: Elgie CollardArash  Bradley M.D.   On: 06/25/2019 23:24    Procedures Procedures (including critical care time)  Medications Ordered in ED Medications  predniSONE (DELTASONE) tablet 60 mg (has no administration in time range)  albuterol (VENTOLIN HFA) 108 (90 Base) MCG/ACT inhaler 6 puff (6 puffs Inhalation Given 06/25/19 2305)     Initial Impression / Assessment and Plan / ED Course  I have reviewed the triage vital signs and the nursing notes.  Pertinent labs & imaging results that were available during  my care of the patient were reviewed by me and considered in my medical decision making (see chart for details).        Patient presents to the emergency department for evaluation of cough, chest congestion, shortness of breath.  He does have a history of asthma, but does not currently have an inhaler and does not generally need to use one.  Symptoms present since yesterday, worse today.  Examination reveals a harsh cough but he is not actively wheezing.  No hypoxia noted.  Chest x-ray performed and reviewed by myself.  No evidence of pneumonia, no findings suspicious for COVID-19 radiologically.  Lab work was otherwise unremarkable.  I do still have high index of suspicion for COVID-19 in this patient based on his presentation.  Certainly he could have simple viral upper respiratory infection/bronchitis not related to coronavirus as well.  Patient given return precautions, quarantine precautions and COVID-19 testing sent to outpatient reference lab.  Will provide treatment for asthma.  Bradley Reid was evaluated in Emergency Department on 06/26/2019 for the symptoms described in the history of present illness. He was evaluated in the context of the global COVID-19 pandemic, which necessitated consideration that the patient might be at risk for infection with the SARS-CoV-2 virus that causes COVID-19. Institutional protocols and algorithms that pertain to the evaluation of patients at risk for COVID-19 are in a state of rapid change based on information released by regulatory bodies including the CDC and federal and state organizations. These policies and algorithms were followed during the patient's care in the ED.   Final Clinical Impressions(s) / ED Diagnoses   Final diagnoses:  Upper respiratory tract infection, unspecified type  Mild intermittent asthma with acute exacerbation    ED Discharge Orders         Ordered    albuterol (VENTOLIN HFA) 108 (90 Base) MCG/ACT inhaler  Every 4 hours PRN      06/26/19 0129    predniSONE (DELTASONE) 20 MG tablet  Daily with breakfast     06/26/19 0129           Orpah Greek, MD 06/26/19 0129    Orpah Greek, MD 06/26/19 [phone]

## 2019-06-25 NOTE — ED Triage Notes (Signed)
PT reports sobx 2-3 hrs,hx of asthma, reports this "feels more serious." has had productive cough. Does not currently have an inhaler

## 2019-06-25 NOTE — ED Provider Notes (Signed)
MSE was initiated and I personally evaluated the patient and placed orders (if any) at  10:46 PM on June 25, 1363.   28 year old male with reported history of asthma presenting to the emergency department for shortness of breath.  He began to feel short of breath yesterday, the symptoms acutely worsened a few hours ago.  He has some discomfort in his central chest with a tightening sensation.  He has also felt increasingly lightheaded with dyspnea on exertion.  Has been coughing more today, but no other preceding viral symptoms.  No associated fevers, syncope, vomiting, leg swelling, hemoptysis.  Denies drug and alcohol use today.  Believes he has history of heart murmur diagnosed in childhood.  No other appreciable cardiac history or family history of sudden cardiac death.  No medications take PTA for symptoms.  Patient appears to be moving air fairly well.  He is not hypoxic.  Appears anxious and though he does not feel well.  Heart rate is significantly variable.  He had a heart rate of 29 when I walked into the room which was confirmed on bedside monitor.   Plan for labs, UDS, EKG, CXR.  He was ordered to receive 6 puffs of an albuterol inhaler to see if it provided any symptomatic improvement given reported asthma history.   The patient appears stable so that the remainder of the MSE may be completed by another provider.   Antonietta Breach, PA-C 06/25/19 2253    Margette Fast, MD 06/26/19 [phone]

## 2019-06-25 NOTE — ED Notes (Signed)
Portable xray at bedside.

## 2019-06-26 LAB — COMPREHENSIVE METABOLIC PANEL
ALT: 15 U/L (ref 0–44)
AST: 26 U/L (ref 15–41)
Albumin: 4.3 g/dL (ref 3.5–5.0)
Alkaline Phosphatase: 61 U/L (ref 38–126)
Anion gap: 10 (ref 5–15)
BUN: 6 mg/dL (ref 6–20)
CO2: 24 mmol/L (ref 22–32)
Calcium: 9.2 mg/dL (ref 8.9–10.3)
Chloride: 103 mmol/L (ref 98–111)
Creatinine, Ser: 1.47 mg/dL — ABNORMAL HIGH (ref 0.61–1.24)
GFR calc Af Amer: >60 mL/min (ref >60)
GFR calc non Af Amer: >60 mL/min (ref >60)
Glucose, Bld: 92 mg/dL (ref 70–99)
Potassium: 3.9 mmol/L (ref 3.5–5.1)
Sodium: 137 mmol/L (ref 135–145)
Total Bilirubin: 0.9 mg/dL (ref 0.3–1.2)
Total Protein: 7.1 g/dL (ref 6.5–8.1)

## 2019-06-26 LAB — MAGNESIUM: Magnesium: 1.7 mg/dL (ref 1.7–2.4)

## 2019-06-26 LAB — TROPONIN I (HIGH SENSITIVITY): Troponin I (High Sensitivity): 4 ng/L (ref <18)

## 2019-06-26 LAB — SARS CORONAVIRUS 2 (TAT 6-24 HRS): SARS Coronavirus 2: NEGATIVE

## 2019-06-26 LAB — TSH: TSH: 0.414 u[IU]/mL (ref 0.350–4.500)

## 2019-06-26 MED ORDER — PREDNISONE 20 MG PO TABS: 40.0000 mg | ORAL_TABLET | Freq: Every day | ORAL | 0 refills | Status: DC

## 2019-06-26 MED ORDER — ALBUTEROL SULFATE HFA 108 (90 BASE) MCG/ACT IN AERS: 2.0000 | INHALATION_SPRAY | RESPIRATORY_TRACT | 2 refills | Status: DC | PRN

## 2019-06-26 MED ORDER — PREDNISONE 20 MG PO TABS
60.0000 mg | ORAL_TABLET | Freq: Once | ORAL | Status: AC
  Administered 2019-06-26: 60 mg via ORAL
  Filled 2019-06-26: qty 3

## 2019-06-26 NOTE — ED Notes (Signed)
Family at bedside. 

## 2020-02-17 ENCOUNTER — Emergency Department (HOSPITAL_COMMUNITY)
Admission: EM | Admit: 2020-02-17 | Discharge: 2020-02-18 | Disposition: A | Discharge status: Home / Self Care | Payer: Self-pay | Attending: Emergency Medicine | Admitting: Emergency Medicine

## 2020-02-17 ENCOUNTER — Other Ambulatory Visit: Payer: Self-pay

## 2020-02-17 DIAGNOSIS — F1420 Cocaine dependence, uncomplicated: Secondary | ICD-10-CM | POA: Insufficient documentation

## 2020-02-17 DIAGNOSIS — J45909 Unspecified asthma, uncomplicated: Secondary | ICD-10-CM | POA: Insufficient documentation

## 2020-02-17 DIAGNOSIS — F17210 Nicotine dependence, cigarettes, uncomplicated: Secondary | ICD-10-CM | POA: Insufficient documentation

## 2020-02-17 DIAGNOSIS — Z79899 Other long term (current) drug therapy: Secondary | ICD-10-CM | POA: Insufficient documentation

## 2020-02-17 DIAGNOSIS — I10 Essential (primary) hypertension: Secondary | ICD-10-CM | POA: Insufficient documentation

## 2020-02-17 DIAGNOSIS — F1920 Other psychoactive substance dependence, uncomplicated: Secondary | ICD-10-CM

## 2020-02-18 ENCOUNTER — Encounter (HOSPITAL_COMMUNITY): Payer: Self-pay

## 2020-02-18 ENCOUNTER — Other Ambulatory Visit: Payer: Self-pay

## 2020-02-18 ENCOUNTER — Emergency Department (HOSPITAL_COMMUNITY)
Admission: EM | Admit: 2020-02-18 | Discharge: 2020-02-19 | Disposition: A | Discharge status: Home / Self Care | Payer: Self-pay | Attending: Emergency Medicine | Admitting: Emergency Medicine

## 2020-02-18 DIAGNOSIS — F11129 Opioid abuse with intoxication, unspecified: Secondary | ICD-10-CM | POA: Insufficient documentation

## 2020-02-18 DIAGNOSIS — I10 Essential (primary) hypertension: Secondary | ICD-10-CM | POA: Insufficient documentation

## 2020-02-18 DIAGNOSIS — F329 Major depressive disorder, single episode, unspecified: Secondary | ICD-10-CM | POA: Insufficient documentation

## 2020-02-18 DIAGNOSIS — F1490 Cocaine use, unspecified, uncomplicated: Secondary | ICD-10-CM | POA: Insufficient documentation

## 2020-02-18 DIAGNOSIS — J45909 Unspecified asthma, uncomplicated: Secondary | ICD-10-CM | POA: Insufficient documentation

## 2020-02-18 DIAGNOSIS — R443 Hallucinations, unspecified: Secondary | ICD-10-CM | POA: Insufficient documentation

## 2020-02-18 DIAGNOSIS — F17210 Nicotine dependence, cigarettes, uncomplicated: Secondary | ICD-10-CM | POA: Insufficient documentation

## 2020-02-18 NOTE — ED Provider Notes (Signed)
[facility] DEPT Provider Note   CSN: [id number] Arrival date & time: 02/17/20  2330     History Chief Complaint  Patient presents with  . Addiction Problem    Bradley Reid is a 29 y.o. male.  Patient to ED with request for help with cocaine addiction. He states daily use. He is concerned because he is starting to "get into trouble for things I do to get more crack". He states he is stealing things. He denies SI/HI/AVH. He states he has called rehabs and they ask him to come in and "fill out papers but something always happens before I get there."   The history is provided by the patient. No language interpreter was used.       Past Medical History:  Diagnosis Date  . Asthma   . Hypertension     There are no problems to display for this patient.   History reviewed. No pertinent surgical history.     History reviewed. No pertinent family history.  Social History   Tobacco Use  . Smoking status: Current Every Day Smoker  Substance Use Topics  . Alcohol use: Yes  . Drug use: Never    Home Medications Prior to Admission medications   Medication Sig Start Date End Date Taking? Authorizing Provider  albuterol (VENTOLIN HFA) 108 (90 Base) MCG/ACT inhaler Inhale 2 puffs into the lungs every 4 (four) hours as needed for wheezing or shortness of breath. 06/26/19   Orpah Greek, MD  amoxicillin (AMOXIL) 500 MG capsule Take 1 capsule (500 mg total) by mouth 3 (three) times daily. 01/02/17   Fransico Meadow, PA-C  cephALEXin (KEFLEX) 500 MG capsule Take 2 capsules (1,000 mg total) by mouth 2 (two) times daily. 03/22/17   Lawyer, Harrell Gave, PA-C  diclofenac (VOLTAREN) 50 MG EC tablet Take 1 tablet (50 mg total) by mouth 2 (two) times daily. 01/02/17   Fransico Meadow, PA-C  predniSONE (DELTASONE) 20 MG tablet Take 2 tablets (40 mg total) by mouth daily with breakfast. 06/26/19   Pollina, Gwenyth Allegra, MD  triamcinolone cream (KENALOG) 0.1  % Apply 1 application topically 2 (two) times daily. 03/22/17   Dalia Heading, PA-C    Allergies    Patient has no known allergies.  Review of Systems   Review of Systems  Constitutional: Negative for fever.  HENT: Negative.   Respiratory: Negative.   Cardiovascular: Negative.   Gastrointestinal: Negative.   Musculoskeletal: Negative.   Psychiatric/Behavioral: Negative for hallucinations and suicidal ideas.    Physical Exam Updated Vital Signs BP 130/64 (BP Location: Left Arm)   Pulse 74   Temp 98.3 F (36.8 C) (Oral)   Resp 16   Ht 5\' 11"  (1.803 m)   Wt 67.1 kg   SpO2 100%   BMI 20.64 kg/m   Physical Exam Vitals and nursing note reviewed.  Constitutional:      Appearance: He is well-developed.  HENT:     Head: Normocephalic.  Cardiovascular:     Rate and Rhythm: Normal rate and regular rhythm.     Heart sounds: No murmur.  Pulmonary:     Effort: Pulmonary effort is normal.     Breath sounds: Normal breath sounds. No wheezing, rhonchi or rales.  Abdominal:     Reid: Bowel sounds are normal.     Palpations: Abdomen is soft.     Tenderness: There is no abdominal tenderness. There is no guarding or rebound.  Musculoskeletal:  Reid: Normal range of motion.     Cervical back: Normal range of motion and neck supple.  Skin:    Reid: Skin is warm and dry.     Findings: No rash.  Neurological:     Mental Status: He is alert and oriented to person, place, and time.  Psychiatric:        Attention and Perception: Attention normal.        Speech: Speech normal.     ED Results / Procedures / Treatments   Labs (all labs ordered are listed, but only abnormal results are displayed) Labs Reviewed - No data to display  EKG None  Radiology No results found.  Procedures Procedures (including critical care time)  Medications Ordered in ED Medications - No data to display  ED Course  I have reviewed the triage vital signs and the nursing  notes.  Pertinent labs & imaging results that were available during my care of the patient were reviewed by me and considered in my medical decision making (see chart for details).    MDM Rules/Calculators/A&P                      Patient to ED requesting help with crack cocaine addiction. No SI/HI/AVH. He does not appears to be acutely psychotic. He is oriented.  Discussed that I can provide resources of inpatient and outpatient centers for treatment. He states "if you can't place me from here I'll just go out and use some more". He is encouraged to take the initiative to get some help. I do not feel he requires hospitalization at this time.    Final Clinical Impression(s) / ED Diagnoses Final diagnoses:  None   1. Crack cocaine abuse/dependence.  Rx / DC Orders ED Discharge Orders    None       Danne Harbor 02/18/20 0055    Nira Conn, MD 02/19/20 0500

## 2020-02-18 NOTE — ED Triage Notes (Signed)
Arrived POV. Patient  reports he is here to be admitted to rehab facility for crack and alcohol. Patient states he has been hearing background voices that make him paranoid and seeing shadows pass by him. Patient reports he last used crack cocaine earlier today and alcohol today.

## 2020-02-18 NOTE — ED Provider Notes (Signed)
[facility] DEPT Provider Note   CSN: [id number] Arrival date & time: 02/18/20  2204     History Chief Complaint  Patient presents with  . Hallucinations    Bradley Reid is a 29 y.o. male.  The history is provided by the patient and medical records.    29 year old male with history of asthma and hypertension, presenting to the ED with hallucinations.  Patient was seen at this facility yesterday requesting detox and was given outpatient resources.  He states he did call a few of the facilities today but was told "we will have to get back with you".  He is continued using crack cocaine today, his last use was over 12 hours ago.  He reports since yesterday he has started hallucinating, seeing shadows, hearing voices and other sounds.  He also feels like some of the people around him are trying to get close to him to kill him.  He states the symptoms are starting to scare him as he has never experienced them before, even when using crack in the past.  He denies any other ingestion today.  When asked about suicidal ideation he states "hmmmmmm" with hesitation.  He reports he has had some days recently where he states "it would be easier to just not be here".  He denies any plan of suicide at this time.  He denies any homicidal ideation.  He states he did talk to his family/friends about his current situation and they were uncomfortable with his "head space" and felt like he needed evaluation.  Past Medical History:  Diagnosis Date  . Asthma   . Hypertension     There are no problems to display for this patient.   No past surgical history on file.     No family history on file.  Social History   Tobacco Use  . Smoking status: Current Every Day Smoker  Substance Use Topics  . Alcohol use: Yes  . Drug use: Never    Home Medications Prior to Admission medications   Medication Sig Start Date End Date Taking? Authorizing Provider  albuterol (VENTOLIN  HFA) 108 (90 Base) MCG/ACT inhaler Inhale 2 puffs into the lungs every 4 (four) hours as needed for wheezing or shortness of breath. Patient not taking: Reported on 02/18/2020 06/26/19   Orpah Greek, MD  amoxicillin (AMOXIL) 500 MG capsule Take 1 capsule (500 mg total) by mouth 3 (three) times daily. Patient not taking: Reported on 02/18/2020 01/02/17   Fransico Meadow, PA-C  cephALEXin (KEFLEX) 500 MG capsule Take 2 capsules (1,000 mg total) by mouth 2 (two) times daily. Patient not taking: Reported on 02/18/2020 03/22/17   Dalia Heading, PA-C  diclofenac (VOLTAREN) 50 MG EC tablet Take 1 tablet (50 mg total) by mouth 2 (two) times daily. Patient not taking: Reported on 02/18/2020 01/02/17   Fransico Meadow, PA-C  predniSONE (DELTASONE) 20 MG tablet Take 2 tablets (40 mg total) by mouth daily with breakfast. Patient not taking: Reported on 02/18/2020 06/26/19   Orpah Greek, MD  triamcinolone cream (KENALOG) 0.1 % Apply 1 application topically 2 (two) times daily. Patient not taking: Reported on 02/18/2020 03/22/17   Dalia Heading, PA-C    Allergies    Patient has no known allergies.  Review of Systems   Review of Systems  Psychiatric/Behavioral: Positive for hallucinations.  All other systems reviewed and are negative.   Physical Exam Updated Vital Signs BP 105/70 (BP Location: Right Arm)   Pulse  88   Temp 99.7 F (37.6 C) (Oral)   Resp 14   Ht 5\' 11"  (1.803 m)   Wt 68 kg   SpO2 98%   BMI 20.92 kg/m   Physical Exam Vitals and nursing note reviewed.  Constitutional:      Appearance: He is well-developed.  HENT:     Head: Normocephalic and atraumatic.  Eyes:     Conjunctiva/sclera: Conjunctivae normal.     Pupils: Pupils are equal, round, and reactive to light.  Cardiovascular:     Rate and Rhythm: Normal rate and regular rhythm.     Heart sounds: Normal heart sounds.  Pulmonary:     Effort: Pulmonary effort is normal.     Breath sounds: Normal breath  sounds.  Abdominal:     General: Bowel sounds are normal.     Palpations: Abdomen is soft.  Musculoskeletal:        General: Normal range of motion.     Cervical back: Normal range of motion.  Skin:    General: Skin is warm and dry.  Neurological:     Mental Status: He is alert and oriented to person, place, and time.  Psychiatric:        Attention and Perception: He perceives auditory and visual hallucinations.        Thought Content: Thought content does not include homicidal or suicidal plan.     Comments: Seems to have insight, does not appear acutely psychotic, not responding to internal stimuli     ED Results / Procedures / Treatments   Labs (all labs ordered are listed, but only abnormal results are displayed) Labs Reviewed  COMPREHENSIVE METABOLIC PANEL - Abnormal; Notable for the following components:      Result Value   Glucose, Bld 104 (*)    Creatinine, Ser 1.30 (*)    All other components within normal limits  SALICYLATE LEVEL - Abnormal; Notable for the following components:   Salicylate Lvl <7.0 (*)    All other components within normal limits  ACETAMINOPHEN LEVEL - Abnormal; Notable for the following components:   Acetaminophen (Tylenol), Serum <10 (*)    All other components within normal limits  ETHANOL  CBC  RAPID URINE DRUG SCREEN, HOSP PERFORMED    EKG None  Radiology No results found.  Procedures Procedures (including critical care time)  Medications Ordered in ED Medications - No data to display  ED Course  I have reviewed the triage vital signs and the nursing notes.  Pertinent labs & imaging results that were available during my care of the patient were reviewed by me and considered in my medical decision making (see chart for details).    MDM Rules/Calculators/A&P  29 year old male presenting to the ED with hallucinations.  States this just began over the past 24 hours.  He has been trying to get into a rehab facility for help with  detox from alcohol and crack cocaine.  States he has been seeing shadows, hearing voices, and experiencing new paranoia where he feels like his friends are going to kill him.  He has never experienced this before, even when using drugs.  He denies any history of psychiatric disease such as bipolar or schizophrenia.  He denies any current suicidal ideation, does report some passive thoughts of "it would be easier not being here".  Denies HI.  Labs as above.  Patient is medically clear.  Some of his hallucinations may be from his drug use, however it sounds like he has  been using drugs for quite some time without the symptoms previously.  He did discuss with family and they were concerned for his wellbeing.  Will get TTS consult.  6:13 AM TTS consult still pending.  Morning team to follow-up on recommendations.  No home meds to be ordered at this time.  Final Clinical Impression(s) / ED Diagnoses Final diagnoses:  Hallucinations  Crack cocaine use    Rx / DC Orders ED Discharge Orders    None       Garlon Hatchet, PA-C 02/19/20 5449    Shon Baton, MD 02/19/20 [phone]

## 2020-02-18 NOTE — Discharge Instructions (Signed)
Good luck finding treatment for cocaine addiction.

## 2020-02-18 NOTE — ED Triage Notes (Signed)
Pt is requesting medical clearance and help to find a drug rehab program. States that he uses crack cocaine and last used around 10p. Denies SI/HI

## 2020-02-18 NOTE — ED Notes (Signed)
Discussed resources with patient who verbalized understanding. Provided with graham crackers and milk per request.

## 2020-02-19 ENCOUNTER — Encounter (HOSPITAL_COMMUNITY): Payer: Self-pay | Admitting: Registered Nurse

## 2020-02-19 LAB — CBC
HCT: 43.5 % (ref 39.0–52.0)
Hemoglobin: 13.8 g/dL (ref 13.0–17.0)
MCH: 31 pg (ref 26.0–34.0)
MCHC: 31.7 g/dL (ref 30.0–36.0)
MCV: 97.8 fL (ref 80.0–100.0)
Platelets: 194 10*3/uL (ref 150–400)
RBC: 4.45 MIL/uL (ref 4.22–5.81)
RDW: 11.8 % (ref 11.5–15.5)
WBC: 8.9 10*3/uL (ref 4.0–10.5)
nRBC: 0 % (ref 0.0–0.2)

## 2020-02-19 LAB — RAPID URINE DRUG SCREEN, HOSP PERFORMED
Amphetamines: NOT DETECTED
Barbiturates: NOT DETECTED
Benzodiazepines: NOT DETECTED
Cocaine: POSITIVE — AB
Opiates: NOT DETECTED
Tetrahydrocannabinol: POSITIVE — AB

## 2020-02-19 LAB — COMPREHENSIVE METABOLIC PANEL
ALT: 18 U/L (ref 0–44)
AST: 24 U/L (ref 15–41)
Albumin: 4.2 g/dL (ref 3.5–5.0)
Alkaline Phosphatase: 77 U/L (ref 38–126)
Anion gap: 7 (ref 5–15)
BUN: 9 mg/dL (ref 6–20)
CO2: 27 mmol/L (ref 22–32)
Calcium: 9.3 mg/dL (ref 8.9–10.3)
Chloride: 107 mmol/L (ref 98–111)
Creatinine, Ser: 1.3 mg/dL — ABNORMAL HIGH (ref 0.61–1.24)
GFR calc Af Amer: >60 mL/min (ref >60)
GFR calc non Af Amer: >60 mL/min (ref >60)
Glucose, Bld: 104 mg/dL — ABNORMAL HIGH (ref 70–99)
Potassium: 3.7 mmol/L (ref 3.5–5.1)
Sodium: 141 mmol/L (ref 135–145)
Total Bilirubin: 0.4 mg/dL (ref 0.3–1.2)
Total Protein: 7.2 g/dL (ref 6.5–8.1)

## 2020-02-19 LAB — ETHANOL: Alcohol, Ethyl (B): <10 mg/dL (ref <10)

## 2020-02-19 LAB — SALICYLATE LEVEL: Salicylate Lvl: <7 mg/dL — ABNORMAL LOW (ref 7.0–30.0)

## 2020-02-19 LAB — ACETAMINOPHEN LEVEL: Acetaminophen (Tylenol), Serum: <10 ug/mL — ABNORMAL LOW (ref 10–30)

## 2020-02-19 NOTE — Patient Outreach (Addendum)
Patient was denied from an [facility] admission due to upcoming court dates. [facility] was able to set up the patient with a [facility] screening assessment at the [facility] center next week on Monday 02/24/19 at 7:45 a.m. [facility] strongly encouraged the patient to follow up with [facility] if needed for any further questions about [facility]'s residential substance use treatment services or for any other help with [facility] related substance use recovery outreach services.

## 2020-02-19 NOTE — B[facility] Suicide Risk Assessment (Cosign Needed)
Suicide Risk Assessment  Discharge Assessment   [facility] Discharge Suicide Risk Assessment   Principal Problem: <principal problem not specified> Discharge Diagnoses: Active Problems:   * No active hospital problems. *   Total Time spent with patient: 30 minutes  Musculoskeletal: Strength & Muscle Tone: within normal limits Gait & Station: normal Patient leans: N/A  Psychiatric Specialty Exam:   Blood pressure (!) 140/119, pulse (!) 50, temperature (!) 97.5 F (36.4 C), temperature source Oral, resp. rate 16, height 5\' 11"  (1.803 m), weight 68 kg, SpO2 100 %.Body mass index is 20.92 kg/m.  General Appearance: Casual  Eye Contact::  Good  Speech:  Clear and Coherent and Normal Rate409  Volume:  Normal  Mood:  "Fine"  Affect:  Appropriate and Congruent  Thought Process:  Coherent, Goal Directed and Descriptions of Associations: Intact  Orientation:  Full (Time, Place, and Person)  Thought Content:  WDL  Suicidal Thoughts:  No  Homicidal Thoughts:  No  Memory:  Immediate;   Good Recent;   Good  Judgement:  Intact  Insight:  Present  Psychomotor Activity:  Normal  Concentration:  Good  Recall:  Good  Fund of Knowledge:Good  Language: Good  Akathisia:  No  Handed:  Right  AIMS (if indicated):     Assets:  Communication Skills Desire for Improvement Housing Social Support  Sleep:     Cognition: WNL  ADL's:  Intact   Mental Status Per Nursing Assessment::   On Admission:    Bradley Reid, 29 y.o., male patient seen via tele psych by this provider, Dr. 26; and chart reviewed on 02/19/20.  On evaluation Bradley Reid reports he has a problem with drug use and is seeking rehab services short term.  Patient denies suicidal/self-harm/homicidal ideation, psychosis, and paranoia.  States that he is living with his grandmother and currently unemployed but wants to get off of drugs and get his life together.   During evaluation Bradley Reid is alert/oriented x 4;  calm/cooperative; and mood is congruent with affect.  He does not appear to be responding to internal/external stimuli or delusional thoughts.  Patient denies suicidal/self-harm/homicidal ideation, psychosis, and paranoia.  Patient answered question appropriately.     Demographic Factors:  Male and Unemployed  Loss Factors: NA  Historical Factors: Impulsivity  Risk Reduction Factors:   Religious beliefs about death, Living with another person, especially a relative and Positive social support  Continued Clinical Symptoms:  Alcohol/Substance Abuse/Dependencies  Cognitive Features That Contribute To Risk:  None    Suicide Risk:  Minimal: No identifiable suicidal ideation.  Patients presenting with no risk factors but with morbid ruminations; may be classified as minimal risk based on the severity of the depressive symptoms    Plan Of Care/Follow-up recommendations:  Activity:  As tolerated Diet:  Heart healthy     Discharge Instructions     To help you maintain a sober lifestyle, a substance abuse treatment program may be beneficial to you.  Contact one of the following facilities at your earliest opportunity to ask about enrolling:  RESIDENTIAL PROGRAMS:       [facility]      [address] [place], [place]      [phone]       [facility]      [address] [place], [place]      [phone]       [facility]  [place], [place] [address]      [phone]  OUTPATIENT PROGRAMS:       [facility], [place] [address]      [phone]      New patients are seen at their walk-in clinic.  Walk-in hours are Monday - Friday from 8:30 am - 12:00 pm, and from 1:00 pm - 2:30 pm.  Walk-in patients are seen on a first come, first served basis, so try to arrive as early as possible for the best chance of being seen the same  day.     Disposition:  Psychiatrically cleared No evidence of imminent risk to self or others at present.   Patient does not meet criteria for psychiatric inpatient admission. Supportive therapy provided about ongoing stressors. Refer to IOP. Discussed crisis plan, support from social network, calling 911, coming to the Emergency Department, and calling Suicide Hotline.   , NP 02/19/2020, 11:59 AM

## 2020-02-19 NOTE — ED Notes (Signed)
Pt to room 35. Pt calm,flat, blunted, cooperative. Pt oriented to unit. Pt resting comfortably in bed.

## 2020-02-19 NOTE — ED Notes (Signed)
Pt DC d off unit to home per provider. Pt alert, calm, cooperative, no s/s of distress.  DC information and resources given to and reviewed with pt, pt acknowledged understanding. Pt ambulatory of unit, escorted by MHT. Pt using bus, per pt.

## 2020-02-19 NOTE — Discharge Instructions (Signed)
To help you maintain a sober lifestyle, a substance abuse treatment program may be beneficial to you.  Contact one of the following facilities at your earliest opportunity to ask about enrolling:  RESIDENTIAL PROGRAMS:       [facility]      [address]      [place], [place] [address]      [phone]       [facility]      [address]      [place], [place] [address]      [phone]       [facility]      [address]      [place], [place] [address]      [phone]  OUTPATIENT PROGRAMS:       [facility]      [address]      ,  [address]      [phone]       New patients are seen at their walk-in clinic.  Walk-in hours are Monday - Friday from 8:30 am - 12:00 pm, and from 1:00 pm - 2:30 pm.  Walk-in patients are seen on a first come, first served basis, so try to arrive as early as possible for the best chance of being seen the same day. 

## 2020-02-19 NOTE — BH Assessment (Addendum)
BHH Assessment Progress Note  Per Shuvon Rankin, FNP, this pt does not require psychiatric hospitalization at this time.  Pt is to be discharged from [facility] with substance about treatment resources.  These have been included in pt's discharge instructions.  Pt would also benefit from seeing Peer Support Specialists, and a peer support consult has been ordered for pt.  Pt's nurse, Waynetta Sandy, has been notified.  Doylene Canning, MA Triage Specialist [phone]

## 2020-02-19 NOTE — Patient Outreach (Signed)
[facility]PSS met with the patient in order to provide substance use recovery support and provide help with getting connected to substance use recovery resources. Patient reports a history of alcohol use and crack cocaine use. Patient's EtOH test was negative. Patient's UDS was positive for cocaine and THC. Patient is passionate about his substance use recovery process and starting substance use treatment. Patient reports no prior history of substance use treatment. Patient is requesting help with a short-term residential substance use treatment admission. Patient does not want to go to long-term residential treatment due to the patient living with his grandmother. [facility] has residential treatment beds available, and patient is agreeable/wants to go to [facility]. CPSS will keep the patient updated on the status of the referral. If the patient is unable to go to [facility] today for residential treatment, patient plans to follow up with the [facility] tomorrow for residential treatment after discharge from the [facility]. CPSS also provided information for other substance use recovery resources for community follow up including residential/outpatient substance use treatment center list, [facility] online/in-person NA meeting list, and [facility]PSS contact information. Patient is pleasant, cooperative, and thanked CPSS for the [facility]PSS services provided.

## 2020-02-22 ENCOUNTER — Encounter (HOSPITAL_COMMUNITY): Payer: Self-pay

## 2020-02-22 ENCOUNTER — Emergency Department (HOSPITAL_COMMUNITY)
Admission: EM | Admit: 2020-02-22 | Discharge: 2020-02-22 | Disposition: A | Discharge status: Home / Self Care | Payer: Self-pay | Attending: Emergency Medicine | Admitting: Emergency Medicine

## 2020-02-22 DIAGNOSIS — I10 Essential (primary) hypertension: Secondary | ICD-10-CM | POA: Insufficient documentation

## 2020-02-22 DIAGNOSIS — F17210 Nicotine dependence, cigarettes, uncomplicated: Secondary | ICD-10-CM | POA: Insufficient documentation

## 2020-02-22 DIAGNOSIS — F410 Panic disorder [episodic paroxysmal anxiety] without agoraphobia: Secondary | ICD-10-CM | POA: Insufficient documentation

## 2020-02-22 DIAGNOSIS — J45909 Unspecified asthma, uncomplicated: Secondary | ICD-10-CM | POA: Insufficient documentation

## 2020-02-22 NOTE — ED Notes (Signed)
Observed pt walking around room and drinking from a cup--would not talk with prior nurse.
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
## 2020-02-22 NOTE — ED Notes (Signed)
Pt offered food -- states "that is all I need then I can go home" cooperative, thanking staff.

## 2020-02-22 NOTE — ED Notes (Signed)
Attempted to assess pt, he was sitting in a chair w/ his head down.  RN attempted to talk to him however he refused to respond to RN other than mumbling incomprehensible words.  RN attempted several times however he would not move or respond.

## 2020-02-23 ENCOUNTER — Other Ambulatory Visit: Payer: Self-pay

## 2020-02-23 ENCOUNTER — Emergency Department (HOSPITAL_COMMUNITY)
Admission: EM | Admit: 2020-02-23 | Discharge: 2020-02-23 | Disposition: A | Discharge status: Home / Self Care | Payer: Self-pay | Attending: Emergency Medicine | Admitting: Emergency Medicine

## 2020-02-23 ENCOUNTER — Encounter (HOSPITAL_COMMUNITY): Payer: Self-pay | Admitting: *Deleted

## 2020-02-23 DIAGNOSIS — F14259 Cocaine dependence with cocaine-induced psychotic disorder, unspecified: Secondary | ICD-10-CM | POA: Insufficient documentation

## 2020-02-23 DIAGNOSIS — Z20822 Contact with and (suspected) exposure to covid-19: Secondary | ICD-10-CM | POA: Insufficient documentation

## 2020-02-23 DIAGNOSIS — F17210 Nicotine dependence, cigarettes, uncomplicated: Secondary | ICD-10-CM | POA: Insufficient documentation

## 2020-02-23 DIAGNOSIS — F14959 Cocaine use, unspecified with cocaine-induced psychotic disorder, unspecified: Secondary | ICD-10-CM

## 2020-02-23 DIAGNOSIS — I10 Essential (primary) hypertension: Secondary | ICD-10-CM | POA: Insufficient documentation

## 2020-02-23 DIAGNOSIS — F14150 Cocaine abuse with cocaine-induced psychotic disorder with delusions: Secondary | ICD-10-CM

## 2020-02-23 DIAGNOSIS — Z590 Homelessness: Secondary | ICD-10-CM | POA: Insufficient documentation

## 2020-02-23 DIAGNOSIS — J45909 Unspecified asthma, uncomplicated: Secondary | ICD-10-CM | POA: Insufficient documentation

## 2020-02-23 DIAGNOSIS — F1414 Cocaine abuse with cocaine-induced mood disorder: Secondary | ICD-10-CM

## 2020-02-23 LAB — COMPREHENSIVE METABOLIC PANEL
ALT: 16 U/L (ref 0–44)
AST: 23 U/L (ref 15–41)
Albumin: 4.8 g/dL (ref 3.5–5.0)
Alkaline Phosphatase: 61 U/L (ref 38–126)
Anion gap: 10 (ref 5–15)
BUN: 14 mg/dL (ref 6–20)
CO2: 28 mmol/L (ref 22–32)
Calcium: 9.8 mg/dL (ref 8.9–10.3)
Chloride: 101 mmol/L (ref 98–111)
Creatinine, Ser: 1.26 mg/dL — ABNORMAL HIGH (ref 0.61–1.24)
GFR calc Af Amer: >60 mL/min (ref >60)
GFR calc non Af Amer: >60 mL/min (ref >60)
Glucose, Bld: 80 mg/dL (ref 70–99)
Potassium: 4.1 mmol/L (ref 3.5–5.1)
Sodium: 139 mmol/L (ref 135–145)
Total Bilirubin: 0.5 mg/dL (ref 0.3–1.2)
Total Protein: 8.1 g/dL (ref 6.5–8.1)

## 2020-02-23 LAB — CBC WITH DIFFERENTIAL/PLATELET
Abs Immature Granulocytes: 0.03 10*3/uL (ref 0.00–0.07)
Basophils Absolute: 0 10*3/uL (ref 0.0–0.1)
Basophils Relative: 0 %
Eosinophils Absolute: 0.2 10*3/uL (ref 0.0–0.5)
Eosinophils Relative: 2 %
HCT: 48.5 % (ref 39.0–52.0)
Hemoglobin: 15.7 g/dL (ref 13.0–17.0)
Immature Granulocytes: 0 %
Lymphocytes Relative: 24 %
Lymphs Abs: 1.8 10*3/uL (ref 0.7–4.0)
MCH: 31.8 pg (ref 26.0–34.0)
MCHC: 32.4 g/dL (ref 30.0–36.0)
MCV: 98.2 fL (ref 80.0–100.0)
Monocytes Absolute: 0.5 10*3/uL (ref 0.1–1.0)
Monocytes Relative: 6 %
Neutro Abs: 5.2 10*3/uL (ref 1.7–7.7)
Neutrophils Relative %: 68 %
Platelets: 237 10*3/uL (ref 150–400)
RBC: 4.94 MIL/uL (ref 4.22–5.81)
RDW: 12.1 % (ref 11.5–15.5)
WBC: 7.7 10*3/uL (ref 4.0–10.5)
nRBC: 0 % (ref 0.0–0.2)

## 2020-02-23 LAB — ETHANOL: Alcohol, Ethyl (B): <10 mg/dL (ref <10)

## 2020-02-23 LAB — SALICYLATE LEVEL: Salicylate Lvl: <7 mg/dL — ABNORMAL LOW (ref 7.0–30.0)

## 2020-02-23 LAB — RAPID URINE DRUG SCREEN, HOSP PERFORMED
Amphetamines: POSITIVE — AB
Barbiturates: NOT DETECTED
Benzodiazepines: NOT DETECTED
Cocaine: POSITIVE — AB
Opiates: NOT DETECTED
Tetrahydrocannabinol: POSITIVE — AB

## 2020-02-23 LAB — RESPIRATORY PANEL BY RT PCR (FLU A&B, COVID)
Influenza A by PCR: NEGATIVE
Influenza B by PCR: NEGATIVE
SARS Coronavirus 2 by RT PCR: NEGATIVE

## 2020-02-23 LAB — ACETAMINOPHEN LEVEL: Acetaminophen (Tylenol), Serum: <10 ug/mL — ABNORMAL LOW (ref 10–30)

## 2020-02-23 NOTE — ED Triage Notes (Signed)
Pt arrives ambulatory to triage, says that he is paranoid, his mind is racing and he cannot think right. Admits to use of crack cocaine today. Denies SI or HI.

## 2020-02-23 NOTE — ED Notes (Signed)
[facility] called to transport patient to [facility].

## 2020-02-23 NOTE — Progress Notes (Signed)
Pt was accepted by Anice Paganini to bed 402-01 in the OBS unit - attending will be MD Lucianne Muss. Report may be called to [phone]

## 2020-02-23 NOTE — ED Notes (Signed)
An After Visit Summary was printed and given to the patient. Discharge instructions given and no further questions at this time.  

## 2020-02-23 NOTE — ED Provider Notes (Signed)
[facility]-EMERGENCY DEPT Provider Note   CSN: [id number] Arrival date & time: 02/23/20  0207     History Chief Complaint  Patient presents with  . Paranoid    Bradley Reid is a 29 y.o. male.   29 year old male with a history of asthma, hypertension, polysubstance abuse presents to the ED complaining of paranoia.  States that he is not homeless and does not need a place to stay, but has been walking the streets and does not feel safe.  Feels that people are out to get him.  His mind has been racing and he is unable to think.  He does endorse crack cocaine use.  Is unsure if it may have been "laced with something".  Denies any other substance abuse or alcohol ingestion.  No SI or HI.  Has been frequenting the emergency department over the past few days.  Last evaluated by psychiatry on 02/19/2020.  The history is provided by the patient. No language interpreter was used.       Past Medical History:  Diagnosis Date  . Asthma   . Hypertension     There are no problems to display for this patient.   History reviewed. No pertinent surgical history.     No family history on file.  Social History   Tobacco Use  . Smoking status: Current Every Day Smoker  . Smokeless tobacco: Never Used  Substance Use Topics  . Alcohol use: Yes  . Drug use: Never    Home Medications Prior to Admission medications   Not on File    Allergies    Shellfish allergy  Review of Systems   Review of Systems  Ten systems reviewed and are negative for acute change, except as noted in the HPI.    Physical Exam Updated Vital Signs BP 105/63   Pulse 73   Temp 97.8 F (36.6 C)   Resp 18   Ht 5\' 11"  (1.803 m)   Wt 65.8 kg   SpO2 100%   BMI 20.22 kg/m   Physical Exam Vitals and nursing note reviewed.  Constitutional:      General: He is not in acute distress.    Appearance: He is well-developed. He is not diaphoretic.     Comments: Disheveled appearance. In  NAD.  HENT:     Head: Normocephalic and atraumatic.  Eyes:     General: No scleral icterus.    Conjunctiva/sclera: Conjunctivae normal.  Pulmonary:     Effort: Pulmonary effort is normal. No respiratory distress.     Comments: Respirations even and unlabored Musculoskeletal:        General: Normal range of motion.     Cervical back: Normal range of motion.  Skin:    General: Skin is warm and dry.     Coloration: Skin is not pale.     Findings: No erythema or rash.  Neurological:     Mental Status: He is alert and oriented to person, place, and time.  Psychiatric:        Speech: Speech normal.        Behavior: Behavior normal.        Thought Content: Thought content does not include homicidal or suicidal ideation.     Comments: Mildly agitated. Not reacting to internal stimuli.     ED Results / Procedures / Treatments   Labs (all labs ordered are listed, but only abnormal results are displayed) Labs Reviewed  COMPREHENSIVE METABOLIC PANEL - Abnormal; Notable for the  following components:      Result Value   Creatinine, Ser 1.26 (*)    All other components within normal limits  RAPID URINE DRUG SCREEN, HOSP PERFORMED - Abnormal; Notable for the following components:   Cocaine POSITIVE (*)    Amphetamines POSITIVE (*)    Tetrahydrocannabinol POSITIVE (*)    All other components within normal limits  ACETAMINOPHEN LEVEL - Abnormal; Notable for the following components:   Acetaminophen (Tylenol), Serum <10 (*)    All other components within normal limits  SALICYLATE LEVEL - Abnormal; Notable for the following components:   Salicylate Lvl <8.0 (*)    All other components within normal limits  RESPIRATORY PANEL BY RT PCR (FLU A&B, COVID)  CBC WITH DIFFERENTIAL/PLATELET  ETHANOL    EKG None  Radiology No results found.  Procedures Procedures (including critical care time)  Medications Ordered in ED Medications - No data to display  ED Course  I have reviewed the  triage vital signs and the nursing notes.  Pertinent labs & imaging results that were available during my care of the patient were reviewed by me and considered in my medical decision making (see chart for details).    MDM Rules/Calculators/A&P                      Patient presenting for paranoia and hallucinations.  Does endorse use of crack cocaine.  Has been medically cleared and evaluated by TTS who have accepted the patient to [facility] after 8AM for continued observation.  No SI/HI.  Patient hemodynamically stable and appropriate for transfer.   

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
## 2020-02-23 NOTE — ED Notes (Signed)
RN informed by patient that he was not going to go to [facility] due to having a bed at [facility] tomorrow at 0745. Dr. Estell Harpin aware. Berneice Heinrich, FNP at [facility] aware and has discharged patient. Pt denies SI, denies HI.

## 2020-02-23 NOTE — Consult Note (Addendum)
[facility] Psych ED Discharge  02/23/2020 12:31 PM Bradley Reid  MRN:  [id number] Principal Problem: Cocaine abuse with cocaine-induced psychotic disorder, with delusions [facility]) Discharge Diagnoses: Principal Problem:   Cocaine abuse with cocaine-induced psychotic disorder, with delusions (HCC)   Subjective: Patient states "I just came here to make sure I went to treatment at [facility]." Patient reports accepted for substance treatment at [facility], 02/24/2020 tomorrow at 0745am. Patient denies suicidal and homicidal ideations.  Patient denies history of suicide attempts, denies history of self-harm.  Patient denies auditory visual hallucinations, patient denies symptoms of paranoia.   Patient reports plan to "stay at my mom's in [place] tonight so that I do not go and smoke crack." Patient reports average sleep and appetite.  Patient reports use of crack cocaine continuously for approximately 1 year.  Patient denies history of mental illness, denies outpatient psychiatry. Patient declines psychotropic medications at this time.  Total Time spent with patient: 30 minutes  Past Psychiatric History: Cocaine use disorder  Past Medical History:  Past Medical History:  Diagnosis Date  . Asthma   . Hypertension    History reviewed. No pertinent surgical history. Family History: No family history on file. Family Psychiatric  History: Denies Social History:  Social History   Substance and Sexual Activity  Alcohol Use Yes     Social History   Substance and Sexual Activity  Drug Use Never    Social History   Socioeconomic History  . Marital status: Single    Spouse name: Not on file  . Number of children: Not on file  . Years of education: Not on file  . Highest education level: Not on file  Occupational History  . Not on file  Tobacco Use  . Smoking status: Current Every Day Smoker  . Smokeless tobacco: Never Used  Substance and Sexual Activity  . Alcohol use: Yes  . Drug use: Never   . Sexual activity: Not on file  Other Topics Concern  . Not on file  Social History Narrative   ** Merged History Encounter **       ** Merged History Encounter **       Social Determinants of Health   Financial Resource Strain:   . Difficulty of Paying Living Expenses:   Food Insecurity:   . Worried About Programme researcher, broadcasting/film/video in the Last Year:   . Barista in the Last Year:   Transportation Needs:   . Freight forwarder (Medical):   Marland Kitchen Lack of Transportation (Non-Medical):   Physical Activity:   . Days of Exercise per Week:   . Minutes of Exercise per Session:   Stress:   . Feeling of Stress :   Social Connections:   . Frequency of Communication with Friends and Family:   . Frequency of Social Gatherings with Friends and Family:   . Attends Religious Services:   . Active Member of Clubs or Organizations:   . Attends Banker Meetings:   Marland Kitchen Marital Status:     Has this patient used any form of tobacco in the last 30 days? (Cigarettes, Smokeless Tobacco, Cigars, and/or Pipes) A prescription for an FDA-approved tobacco cessation medication was offered at discharge and the patient refused  Current Medications: No current facility-administered medications for this encounter.   No current outpatient medications on file.   PTA Medications: (Not in a hospital admission)   Musculoskeletal: Strength & Muscle Tone: within normal limits Gait & Station: normal Patient leans: N/A  Psychiatric Specialty Exam: Physical Exam Vitals and nursing note reviewed.  Constitutional:      Appearance: He is well-developed.  HENT:     Head: Normocephalic.  Cardiovascular:     Rate and Rhythm: Normal rate.  Pulmonary:     Effort: Pulmonary effort is normal.  Neurological:     Mental Status: He is alert and oriented to person, place, and time.  Psychiatric:        Mood and Affect: Mood normal.        Behavior: Behavior normal.        Thought Content: Thought  content normal.        Judgment: Judgment normal.     Review of Systems  Constitutional: Negative.   HENT: Negative.   Eyes: Negative.   Respiratory: Negative.   Cardiovascular: Negative.   Gastrointestinal: Negative.   Genitourinary: Negative.   Musculoskeletal: Negative.   Skin: Negative.   Neurological: Negative.   Psychiatric/Behavioral: Negative.     Blood pressure 95/64, pulse 79, temperature 97.8 F (36.6 C), resp. rate 16, height 5\' 11"  (1.803 m), weight 65.8 kg, SpO2 100 %.Body mass index is 20.22 kg/m.  General Appearance: Casual and Fairly Groomed  Eye Contact:  Good  Speech:  Clear and Coherent and Normal Rate  Volume:  Normal  Mood:  Euthymic  Affect:  Appropriate and Congruent  Thought Process:  Coherent, Goal Directed and Descriptions of Associations: Intact  Orientation:  Full (Time, Place, and Person)  Thought Content:  WDL and Logical  Suicidal Thoughts:  No  Homicidal Thoughts:  No  Memory:  Immediate;   Good Recent;   Good Remote;   Good  Judgement:  Good  Insight:  Good  Psychomotor Activity:  Normal  Concentration:  Concentration: Good and Attention Span: Good  Recall:  Good  Fund of Knowledge:  Good  Language:  Good  Akathisia:  No  Handed:  Right  AIMS (if indicated):     Assets:  Communication Skills Desire for Improvement Financial Resources/Insurance Housing Intimacy Leisure Time Physical Health Resilience Social Support Talents/Skills  ADL's:  Intact  Cognition:  WNL  Sleep:        Demographic Factors:  Male  Loss Factors: NA  Historical Factors: NA  Risk Reduction Factors:   Employed, Living with another person, especially a relative, Positive social support, Positive therapeutic relationship and Positive coping skills or problem solving skills  Continued Clinical Symptoms:  Alcohol/Substance Abuse/Dependencies  Cognitive Features That Contribute To Risk:  None    Suicide Risk:  Minimal: No identifiable  suicidal ideation.  Patients presenting with no risk factors but with morbid ruminations; may be classified as minimal risk based on the severity of the depressive symptoms    Plan Of Care/Follow-up recommendations:  Other:  Patient plans to attend rehabilitation at [facility] on tomorrow, Monday 02/23/2020  Disposition: Discharge Emmaline Kluver, [place] 02/23/2020, 12:31 PM  Patient seen face-to-face for psychiatric evaluation, chart reviewed and case discussed with the physician extender and developed treatment plan. Reviewed the information documented and agree with the treatment plan. Corena Pilgrim, MD

## 2020-03-02 ENCOUNTER — Emergency Department (HOSPITAL_COMMUNITY): Payer: Self-pay

## 2020-03-02 ENCOUNTER — Emergency Department (HOSPITAL_COMMUNITY)
Admission: EM | Admit: 2020-03-02 | Discharge: 2020-03-03 | Disposition: A | Discharge status: Home / Self Care | Payer: Self-pay | Attending: Emergency Medicine | Admitting: Emergency Medicine

## 2020-03-02 ENCOUNTER — Encounter (HOSPITAL_COMMUNITY): Payer: Self-pay

## 2020-03-02 ENCOUNTER — Other Ambulatory Visit: Payer: Self-pay

## 2020-03-02 ENCOUNTER — Emergency Department (HOSPITAL_COMMUNITY)
Admission: EM | Admit: 2020-03-02 | Discharge: 2020-03-02 | Disposition: A | Discharge status: Home / Self Care | Payer: Self-pay | Attending: Emergency Medicine | Admitting: Emergency Medicine

## 2020-03-02 ENCOUNTER — Encounter (HOSPITAL_COMMUNITY): Payer: Self-pay | Admitting: Emergency Medicine

## 2020-03-02 DIAGNOSIS — F1910 Other psychoactive substance abuse, uncomplicated: Secondary | ICD-10-CM | POA: Insufficient documentation

## 2020-03-02 DIAGNOSIS — F17210 Nicotine dependence, cigarettes, uncomplicated: Secondary | ICD-10-CM | POA: Insufficient documentation

## 2020-03-02 DIAGNOSIS — J45909 Unspecified asthma, uncomplicated: Secondary | ICD-10-CM | POA: Insufficient documentation

## 2020-03-02 DIAGNOSIS — Z5321 Procedure and treatment not carried out due to patient leaving prior to being seen by health care provider: Secondary | ICD-10-CM | POA: Insufficient documentation

## 2020-03-02 DIAGNOSIS — I491 Atrial premature depolarization: Secondary | ICD-10-CM | POA: Insufficient documentation

## 2020-03-02 DIAGNOSIS — I10 Essential (primary) hypertension: Secondary | ICD-10-CM | POA: Insufficient documentation

## 2020-03-02 LAB — BASIC METABOLIC PANEL
Anion gap: 9 (ref 5–15)
BUN: 14 mg/dL (ref 6–20)
CO2: 27 mmol/L (ref 22–32)
Calcium: 9.5 mg/dL (ref 8.9–10.3)
Chloride: 102 mmol/L (ref 98–111)
Creatinine, Ser: 1.32 mg/dL — ABNORMAL HIGH (ref 0.61–1.24)
GFR calc Af Amer: >60 mL/min (ref >60)
GFR calc non Af Amer: >60 mL/min (ref >60)
Glucose, Bld: 87 mg/dL (ref 70–99)
Potassium: 4.7 mmol/L (ref 3.5–5.1)
Sodium: 138 mmol/L (ref 135–145)

## 2020-03-02 LAB — CBC
HCT: 47.7 % (ref 39.0–52.0)
Hemoglobin: 15 g/dL (ref 13.0–17.0)
MCH: 31.6 pg (ref 26.0–34.0)
MCHC: 31.4 g/dL (ref 30.0–36.0)
MCV: 100.6 fL — ABNORMAL HIGH (ref 80.0–100.0)
Platelets: 188 10*3/uL (ref 150–400)
RBC: 4.74 MIL/uL (ref 4.22–5.81)
RDW: 12.2 % (ref 11.5–15.5)
WBC: 6.1 10*3/uL (ref 4.0–10.5)
nRBC: 0 % (ref 0.0–0.2)

## 2020-03-02 MED ORDER — SODIUM CHLORIDE 0.9% FLUSH: 3.0000 mL | Freq: Once | INTRAVENOUS | Status: DC

## 2020-03-02 NOTE — ED Provider Notes (Signed)
[facility] DEPT Provider Note   CSN: [id number] Arrival date & time: 03/02/20  1344     History Chief Complaint  Patient presents with  . Atrial Fibrillation    Bradley Reid is a 29 y.o. male.  Patient was sent over from an urgent care for irregular heartbeat.  Patient was at a substance abuse program and they noticed the irregular heartbeat and sent him to the urgent care.  Patient does abuse cocaine.  Patient states that he does not have any symptoms or irregular heartbeat  The history is provided by the patient. No language interpreter was used.  Palpitations Palpitations quality:  Irregular Onset quality:  Unable to specify Duration: Unknown. Timing:  Constant Progression:  Unchanged Chronicity:  New Context: not anxiety   Relieved by:  Nothing Worsened by:  Nothing Ineffective treatments:  None tried Associated symptoms: no back pain, no chest pain and no cough   Risk factors: no diabetes mellitus        Past Medical History:  Diagnosis Date  . Asthma   . Hypertension     Patient Active Problem List   Diagnosis Date Noted  . Cocaine abuse with cocaine-induced psychotic disorder, with delusions ([place]) 02/23/2020    History reviewed. No pertinent surgical history.     Family History  Problem Relation Age of Onset  . Healthy Mother   . Healthy Father     Social History   Tobacco Use  . Smoking status: Current Every Day Smoker    Packs/day: 0.50    Types: Cigarettes  . Smokeless tobacco: Never Used  Substance Use Topics  . Alcohol use: Yes  . Drug use: Yes    Types: Cocaine    Home Medications Prior to Admission medications   Not on File    Allergies    Shellfish allergy  Review of Systems   Review of Systems  Constitutional: Negative for appetite change and fatigue.  HENT: Negative for congestion, ear discharge and sinus pressure.   Eyes: Negative for discharge.  Respiratory: Negative for cough.     Cardiovascular: Positive for palpitations. Negative for chest pain.  Gastrointestinal: Negative for abdominal pain and diarrhea.  Genitourinary: Negative for frequency and hematuria.  Musculoskeletal: Negative for back pain.  Skin: Negative for rash.  Neurological: Negative for seizures and headaches.  Psychiatric/Behavioral: Negative for hallucinations.    Physical Exam Updated Vital Signs BP 104/74 (BP Location: Left Arm)   Pulse (!) 55   Temp 98.1 F (36.7 C) (Oral)   Resp 16   Ht 5\' 11"  (1.803 m)   Wt 69.4 kg   SpO2 100%   BMI 21.34 kg/m   Physical Exam Vitals and nursing note reviewed.  Constitutional:      Appearance: He is well-developed.  HENT:     Head: Normocephalic.     Nose: Nose normal.  Eyes:     General: No scleral icterus.    Conjunctiva/sclera: Conjunctivae normal.  Neck:     Thyroid: No thyromegaly.  Cardiovascular:     Rate and Rhythm: Normal rate.     Heart sounds: No murmur. No friction rub. No gallop.      Comments: Irregular heartbeat Pulmonary:     Breath sounds: No stridor. No wheezing or rales.  Chest:     Chest wall: No tenderness.  Abdominal:     General: There is no distension.     Tenderness: There is no abdominal tenderness. There is no rebound.  Musculoskeletal:  General: Normal range of motion.     Cervical back: Neck supple.  Lymphadenopathy:     Cervical: No cervical adenopathy.  Skin:    Findings: No erythema or rash.  Neurological:     Mental Status: He is oriented to person, place, and time.     Motor: No abnormal muscle tone.     Coordination: Coordination normal.  Psychiatric:        Behavior: Behavior normal.     ED Results / Procedures / Treatments   Labs (all labs ordered are listed, but only abnormal results are displayed) Labs Reviewed  BASIC METABOLIC PANEL - Abnormal; Notable for the following components:      Result Value   Creatinine, Ser 1.32 (*)    All other components within normal limits   CBC - Abnormal; Notable for the following components:   MCV 100.6 (*)    All other components within normal limits    EKG None  Radiology DG Chest 2 View  Result Date: 03/02/2020 CLINICAL DATA:  29 year old male with history of atrial fibrillation. EXAM: CHEST - 2 VIEW COMPARISON:  Chest x-ray 06/25/2019. FINDINGS: Lung volumes are normal. No consolidative airspace disease. No pleural effusions. No pneumothorax. No pulmonary nodule or mass noted. Pulmonary vasculature and the cardiomediastinal silhouette are within normal limits. IMPRESSION: No radiographic evidence of acute cardiopulmonary disease. Electronically Signed   By: Trudie Reed M.D.   On: 03/02/2020 14:54    Procedures Procedures (including critical care time)  Medications Ordered in ED Medications  sodium chloride flush (NS) 0.9 % injection 3 mL (has no administration in time range)    ED Course  I have reviewed the triage vital signs and the nursing notes.  Pertinent labs & imaging results that were available during my care of the patient were reviewed by me and considered in my medical decision making (see chart for details).    MDM Rules/Calculators/A&P                      EKG shows PACs otherwise negative.  Labs unremarkable.  Patient is discharged home to follow-up with cardiology and can participate in substance abuse program.  He is to stop cocaine and caffeine      This patient presents to the ED for concern of irregular heartbeat, this involves an extensive number of treatment options, and is a complaint that carries with it a high risk of complications and morbidity.  The differential diagnosis includes atrial fib PACs   Lab Tests:   I Ordered, reviewed, and interpreted labs, which included CBCs and chemistries that are unremarkable  Medicines ordered:     Imaging Studies ordered:   I ordered imaging studies which included chest x-ray and  I independently visualized and interpreted  imaging which showed no acute disease  Additional history obtained:   Additional history obtained from urgent care record  Previous records obtained and reviewed   Consultations Obtained:   Reevaluation:  After the interventions stated above, I reevaluated the patient and found he was still having asymptomatic PACs.  He is told to stop using illegal substances and caffeine.  Patient will follow up with cardiology at some point but is cleared for treatment of substance abuse  Critical Interventions:  .   Final Clinical Impression(s) / ED Diagnoses Final diagnoses:  PAC (premature atrial contraction)    Rx / DC Orders ED Discharge Orders    None       Bethann Berkshire, MD  03/02/20 1629  

## 2020-03-02 NOTE — ED Triage Notes (Signed)
Patient states he was checking into [facility] today and they noted that he had an irrgular heart beat. Patient went to an UC and was told to come to the ED.   Patient denies any chest pain or SOB. Patient did have atrial fib on monitor.

## 2020-03-02 NOTE — ED Triage Notes (Signed)
Pt states he used drugs and is now high and needs to be checked out.  He wants his heart checked.

## 2020-03-02 NOTE — Discharge Instructions (Addendum)
Stay away from caffeine or any illicit drugs.  You may participate in any substance abuse program.  You are medically cleared for that.  Follow-up with [facility] and 2 to 4 weeks for just a checkup of your irregular heartbeat

## 2020-03-03 ENCOUNTER — Encounter (HOSPITAL_COMMUNITY): Payer: Self-pay | Admitting: Emergency Medicine

## 2020-03-03 ENCOUNTER — Emergency Department (HOSPITAL_COMMUNITY)
Admission: EM | Admit: 2020-03-03 | Discharge: 2020-03-04 | Disposition: A | Discharge status: Home / Self Care | Payer: Self-pay | Attending: Emergency Medicine | Admitting: Emergency Medicine

## 2020-03-03 ENCOUNTER — Other Ambulatory Visit: Payer: Self-pay

## 2020-03-03 DIAGNOSIS — R079 Chest pain, unspecified: Secondary | ICD-10-CM

## 2020-03-03 DIAGNOSIS — F1290 Cannabis use, unspecified, uncomplicated: Secondary | ICD-10-CM | POA: Insufficient documentation

## 2020-03-03 DIAGNOSIS — R0789 Other chest pain: Secondary | ICD-10-CM | POA: Insufficient documentation

## 2020-03-03 DIAGNOSIS — F17210 Nicotine dependence, cigarettes, uncomplicated: Secondary | ICD-10-CM | POA: Insufficient documentation

## 2020-03-03 DIAGNOSIS — F1490 Cocaine use, unspecified, uncomplicated: Secondary | ICD-10-CM | POA: Insufficient documentation

## 2020-03-03 DIAGNOSIS — I10 Essential (primary) hypertension: Secondary | ICD-10-CM | POA: Insufficient documentation

## 2020-03-03 LAB — COMPREHENSIVE METABOLIC PANEL
ALT: 24 U/L (ref 0–44)
AST: 28 U/L (ref 15–41)
Albumin: 4.6 g/dL (ref 3.5–5.0)
Alkaline Phosphatase: 55 U/L (ref 38–126)
Anion gap: 11 (ref 5–15)
BUN: 11 mg/dL (ref 6–20)
CO2: 24 mmol/L (ref 22–32)
Calcium: 9.8 mg/dL (ref 8.9–10.3)
Chloride: 105 mmol/L (ref 98–111)
Creatinine, Ser: 1.6 mg/dL — ABNORMAL HIGH (ref 0.61–1.24)
GFR calc Af Amer: >60 mL/min (ref >60)
GFR calc non Af Amer: 57 mL/min — ABNORMAL LOW (ref >60)
Glucose, Bld: 116 mg/dL — ABNORMAL HIGH (ref 70–99)
Potassium: 4.4 mmol/L (ref 3.5–5.1)
Sodium: 140 mmol/L (ref 135–145)
Total Bilirubin: 0.3 mg/dL (ref 0.3–1.2)
Total Protein: 7.5 g/dL (ref 6.5–8.1)

## 2020-03-03 LAB — CBC
HCT: 45.8 % (ref 39.0–52.0)
Hemoglobin: 14.5 g/dL (ref 13.0–17.0)
MCH: 30.9 pg (ref 26.0–34.0)
MCHC: 31.7 g/dL (ref 30.0–36.0)
MCV: 97.7 fL (ref 80.0–100.0)
Platelets: 202 10*3/uL (ref 150–400)
RBC: 4.69 MIL/uL (ref 4.22–5.81)
RDW: 12.4 % (ref 11.5–15.5)
WBC: 10.3 10*3/uL (ref 4.0–10.5)
nRBC: 0 % (ref 0.0–0.2)

## 2020-03-03 LAB — LACTIC ACID, PLASMA: Lactic Acid, Venous: 3.1 mmol/L (ref 0.5–1.9)

## 2020-03-03 LAB — RAPID URINE DRUG SCREEN, HOSP PERFORMED
Amphetamines: NOT DETECTED
Barbiturates: NOT DETECTED
Benzodiazepines: NOT DETECTED
Cocaine: POSITIVE — AB
Opiates: NOT DETECTED
Tetrahydrocannabinol: POSITIVE — AB

## 2020-03-03 LAB — TROPONIN I (HIGH SENSITIVITY): Troponin I (High Sensitivity): 8 ng/L (ref <18)

## 2020-03-03 LAB — PROTIME-INR
INR: 1 (ref 0.8–1.2)
Prothrombin Time: 12.7 seconds (ref 11.4–15.2)

## 2020-03-03 MED ORDER — SODIUM CHLORIDE 0.9% FLUSH: 3.0000 mL | Freq: Once | INTRAVENOUS | Status: DC

## 2020-03-03 NOTE — ED Triage Notes (Signed)
Pt brought to ED by [facility] from [employer], per EMS pt left AMA this morning for irregular [place] beat, he started this evening to jump on [employer] counter, very paranoid, pt denies any of this. Per EMS pt is AO x 4 with no neuro deficit. BP 142/80, HR 120, R 20, SPO2 100% RA.

## 2020-03-04 LAB — LACTIC ACID, PLASMA: Lactic Acid, Venous: 2.9 mmol/L (ref 0.5–1.9)

## 2020-03-04 LAB — TROPONIN I (HIGH SENSITIVITY): Troponin I (High Sensitivity): 12 ng/L (ref <18)

## 2020-03-04 MED ORDER — SODIUM CHLORIDE 0.9 % IV BOLUS: 1000.0000 mL | Freq: Once | INTRAVENOUS | Status: DC

## 2020-03-04 NOTE — ED Provider Notes (Signed)
[facility] EMERGENCY DEPARTMENT Provider Note   CSN: [id number] Arrival date & time: 03/03/20  2011     History Chief Complaint  Patient presents with  . Altered Mental Status    Bradley Reid is a 29 y.o. male.  The history is provided by the patient and medical records.  Altered Mental Status   29 y.o. M with hx of asthma, HTN, polysubstance abuse, presenting to the ED complaining of chest pain.  Patient states he was seen in the ED yesterday, however "no one can figure out what is wrong."  Per chart review patient was at a substance abuse treatment center and found to have irregular heartbeat and sent to the urgent care for further evaluation.  EKG yesterday did show some PAC's-- he was encouraged to stop using cocaine and caffeine.  Patient states he is supposed to go to [facility] this morning at 7:45 AM to start treatment, however "ID my heart checked out first".  He reports ongoing cocaine abuse.  His description of pain is vague, states left sided but then falls asleep before giving further details.  Denies SI/HI/AVH.  Past Medical History:  Diagnosis Date  . Asthma   . Hypertension     Patient Active Problem List   Diagnosis Date Noted  . Cocaine abuse with cocaine-induced psychotic disorder, with delusions (HCC) 02/23/2020    History reviewed. No pertinent surgical history.     Family History  Problem Relation Age of Onset  . Healthy Mother   . Healthy Father     Social History   Tobacco Use  . Smoking status: Current Every Day Smoker    Packs/day: 0.50    Types: Cigarettes  . Smokeless tobacco: Never Used  Substance Use Topics  . Alcohol use: Yes  . Drug use: Yes    Types: Cocaine    Home Medications Prior to Admission medications   Not on File    Allergies    Shellfish allergy  Review of Systems   Review of Systems  Cardiovascular: Positive for chest pain.  All other systems reviewed and are negative.   Physical Exam  Updated Vital Signs BP 97/66 (BP Location: Left Arm)   Pulse (!) 36   Temp 98.9 F (37.2 C) (Oral)   Resp (!) 21   Ht 5\' 11"  (1.803 m)   Wt 69.4 kg   SpO2 98%   BMI 21.34 kg/m   Physical Exam Vitals and nursing note reviewed.  Constitutional:      Appearance: He is well-developed.     Comments: Sleeping, had to be awoken for exam  HENT:     Head: Normocephalic and atraumatic.  Eyes:     Conjunctiva/sclera: Conjunctivae normal.     Pupils: Pupils are equal, round, and reactive to light.  Cardiovascular:     Rate and Rhythm: Normal rate and regular rhythm.     Heart sounds: Normal heart sounds.  Pulmonary:     Effort: Pulmonary effort is normal.     Breath sounds: Normal breath sounds.  Abdominal:     General: Bowel sounds are normal.     Palpations: Abdomen is soft.  Musculoskeletal:        General: Normal range of motion.     Cervical back: Normal range of motion.  Skin:    General: Skin is warm and dry.  Neurological:     Mental Status: He is alert and oriented to person, place, and time.  Psychiatric:  Comments: Odd affect, denies SI/HI/AVH     ED Results / Procedures / Treatments   Labs (all labs ordered are listed, but only abnormal results are displayed) Labs Reviewed  COMPREHENSIVE METABOLIC PANEL - Abnormal; Notable for the following components:      Result Value   Glucose, Bld 116 (*)    Creatinine, Ser 1.60 (*)    GFR calc non Af Amer 57 (*)    All other components within normal limits  LACTIC ACID, PLASMA - Abnormal; Notable for the following components:   Lactic Acid, Venous 3.1 (*)    All other components within normal limits  LACTIC ACID, PLASMA - Abnormal; Notable for the following components:   Lactic Acid, Venous 2.9 (*)    All other components within normal limits  RAPID URINE DRUG SCREEN, HOSP PERFORMED - Abnormal; Notable for the following components:   Cocaine POSITIVE (*)    Tetrahydrocannabinol POSITIVE (*)    All other  components within normal limits  CBC  PROTIME-INR  CBG MONITORING, ED  TROPONIN I (HIGH SENSITIVITY)  TROPONIN I (HIGH SENSITIVITY)    EKG None  Radiology DG Chest 2 View  Result Date: 03/02/2020 CLINICAL DATA:  29 year old male with history of atrial fibrillation. EXAM: CHEST - 2 VIEW COMPARISON:  Chest x-ray 06/25/2019. FINDINGS: Lung volumes are normal. No consolidative airspace disease. No pleural effusions. No pneumothorax. No pulmonary nodule or mass noted. Pulmonary vasculature and the cardiomediastinal silhouette are within normal limits. IMPRESSION: No radiographic evidence of acute cardiopulmonary disease. Electronically Signed   By: Vinnie Langton M.D.   On: 03/02/2020 14:54    Procedures Procedures (including critical care time)  Medications Ordered in ED Medications  sodium chloride flush (NS) 0.9 % injection 3 mL (has no administration in time range)  sodium chloride 0.9 % bolus 1,000 mL (1,000 mLs Intravenous Refused 03/04/20 0550)    ED Course  I have reviewed the triage vital signs and the nursing notes.  Pertinent labs & imaging results that were available during my care of the patient were reviewed by me and considered in my medical decision making (see chart for details).    MDM Rules/Calculators/A&P  29 year old male presenting to the ED with reported chest pain. He was seen in the ED yesterday due to "irregular heartbeat", was found to have PAC's.  He states he is supposed to start treatment at [facility] today and wanted this checked out prior to going. He does report ongoing cocaine abuse.  Denies SI/HI/AVH. He is sleeping on initial assessment here, had to be awoken for exam. He is in no acute distress. His description of pain is vague, he does report it is "left-sided". His EKG with sinus tach, however heart rate is normal during time of exam. His labs are overall reassuring, negative troponin x2. He does have a noted lactic acidosis, I suspect this is likely  from his ongoing drug use. He has no fever or other systemic symptoms concerning for infection/sepsis. Attempted to give IV fluids, however patient refused and began cursing at nurse. He was offered oral hydration, has been able to drink a few cups of water.  Will monitor here.  6:28 AM Has continued drinking fluids, eating sandwich.  HR remains stable.  Still without fever or other infectious symptoms.  Do not suspect sepsis or other complication at this time.  Continue to feel his lactic acidosis likely from agitation/drug use.  I feel he is stable to begin drug treatment this morning as scheduled.  Grandmother is coming to pick him up to take him to [facility].  He may return here for any new/acute changes.  Final Clinical Impression(s) / ED Diagnoses Final diagnoses:  Chest pain in adult    Rx / DC Orders ED Discharge Orders    None       Garlon Hatchet, PA-C 03/04/20 2761    Gilda Crease, MD 03/04/20 0630

## 2020-03-04 NOTE — Discharge Instructions (Signed)
Go to [facility] today to start your treatment. Return here for any new/acute changes.

## 2020-03-04 NOTE — ED Notes (Signed)
Food and drink provided on triage while waiting for a room. Dr. Blinda Leatherwood notified.

## 2020-03-04 NOTE — ED Notes (Signed)
Pt refused to allow RN to start IV and give fluids.  RN explained the benefit of fluids.  RN also offered water and crackers.  Pt did take the water.    Provider aware and requested that we provide pt with plenty of water.

## 2020-08-01 ENCOUNTER — Encounter (HOSPITAL_COMMUNITY): Payer: Self-pay | Admitting: Emergency Medicine

## 2020-08-01 ENCOUNTER — Emergency Department (HOSPITAL_COMMUNITY): Payer: Self-pay

## 2020-08-01 ENCOUNTER — Emergency Department (HOSPITAL_COMMUNITY)
Admission: EM | Admit: 2020-08-01 | Discharge: 2020-08-02 | Disposition: A | Discharge status: Home / Self Care | Payer: Self-pay | Attending: Emergency Medicine | Admitting: Emergency Medicine

## 2020-08-01 ENCOUNTER — Other Ambulatory Visit: Payer: Self-pay

## 2020-08-01 DIAGNOSIS — I10 Essential (primary) hypertension: Secondary | ICD-10-CM | POA: Insufficient documentation

## 2020-08-01 DIAGNOSIS — J45909 Unspecified asthma, uncomplicated: Secondary | ICD-10-CM | POA: Insufficient documentation

## 2020-08-01 DIAGNOSIS — F1910 Other psychoactive substance abuse, uncomplicated: Secondary | ICD-10-CM

## 2020-08-01 DIAGNOSIS — R0602 Shortness of breath: Secondary | ICD-10-CM | POA: Insufficient documentation

## 2020-08-01 DIAGNOSIS — F410 Panic disorder [episodic paroxysmal anxiety] without agoraphobia: Secondary | ICD-10-CM | POA: Insufficient documentation

## 2020-08-01 DIAGNOSIS — F1920 Other psychoactive substance dependence, uncomplicated: Secondary | ICD-10-CM | POA: Insufficient documentation

## 2020-08-01 DIAGNOSIS — F17210 Nicotine dependence, cigarettes, uncomplicated: Secondary | ICD-10-CM | POA: Insufficient documentation

## 2020-08-01 LAB — CBC WITH DIFFERENTIAL/PLATELET
Abs Immature Granulocytes: 0.01 10*3/uL (ref 0.00–0.07)
Basophils Absolute: 0 10*3/uL (ref 0.0–0.1)
Basophils Relative: 0 %
Eosinophils Absolute: 0 10*3/uL (ref 0.0–0.5)
Eosinophils Relative: 0 %
HCT: 41.1 % (ref 39.0–52.0)
Hemoglobin: 13.2 g/dL (ref 13.0–17.0)
Immature Granulocytes: 0 %
Lymphocytes Relative: 13 %
Lymphs Abs: 1.1 10*3/uL (ref 0.7–4.0)
MCH: 30.7 pg (ref 26.0–34.0)
MCHC: 32.1 g/dL (ref 30.0–36.0)
MCV: 95.6 fL (ref 80.0–100.0)
Monocytes Absolute: 0.8 10*3/uL (ref 0.1–1.0)
Monocytes Relative: 9 %
Neutro Abs: 6.7 10*3/uL (ref 1.7–7.7)
Neutrophils Relative %: 78 %
Platelets: 183 10*3/uL (ref 150–400)
RBC: 4.3 MIL/uL (ref 4.22–5.81)
RDW: 12.1 % (ref 11.5–15.5)
WBC: 8.6 10*3/uL (ref 4.0–10.5)
nRBC: 0 % (ref 0.0–0.2)

## 2020-08-01 MED ORDER — LORAZEPAM 1 MG PO TABS
1.0000 mg | ORAL_TABLET | Freq: Once | ORAL | Status: AC
  Administered 2020-08-02: 1 mg via ORAL
  Filled 2020-08-01: qty 1

## 2020-08-01 NOTE — ED Triage Notes (Signed)
Pt to ED with c/o anxiety attack, shortness of breath and chest pain.  Onset 1 hour ago

## 2020-08-01 NOTE — ED Notes (Signed)
Patient not in room

## 2020-08-01 NOTE — ED Provider Notes (Signed)
[facility] EMERGENCY DEPARTMENT Provider Note   CSN: [id number] Arrival date & time: 08/01/20  2117     History Chief Complaint  Patient presents with  . anxiety    Bradley Reid is a 29 y.o. male.  Patient presents with complaints of chest discomfort and difficulty breathing.  Patient reports that he feels extremely anxious.  Symptoms began after smoking what he thought was ice.        Past Medical History:  Diagnosis Date  . Asthma   . Hypertension     Patient Active Problem List   Diagnosis Date Noted  . Cocaine abuse with cocaine-induced psychotic disorder, with delusions (HCC) 02/23/2020    History reviewed. No pertinent surgical history.     Family History  Problem Relation Age of Onset  . Healthy Mother   . Healthy Father     Social History   Tobacco Use  . Smoking status: Current Every Day Smoker    Packs/day: 0.50    Types: Cigarettes  . Smokeless tobacco: Never Used  Vaping Use  . Vaping Use: Never used  Substance Use Topics  . Alcohol use: Yes  . Drug use: Yes    Types: Cocaine    Home Medications Prior to Admission medications   Not on File    Allergies    Shellfish allergy  Review of Systems   Review of Systems  Respiratory: Positive for chest tightness and shortness of breath.   Psychiatric/Behavioral: The patient is nervous/anxious.   All other systems reviewed and are negative.   Physical Exam Updated Vital Signs BP 127/66 (BP Location: Left Arm)   Pulse 92   Temp 98.8 F (37.1 C) (Oral)   Resp 18   Ht 5\' 11"  (1.803 m)   Wt 72.6 kg   SpO2 97%   BMI 22.32 kg/m   Physical Exam Vitals and nursing note reviewed.  Constitutional:      General: He is not in acute distress.    Appearance: Normal appearance. He is well-developed.  HENT:     Head: Normocephalic and atraumatic.     Right Ear: Hearing normal.     Left Ear: Hearing normal.     Nose: Nose normal.  Eyes:     Conjunctiva/sclera:  Conjunctivae normal.     Pupils: Pupils are equal, round, and reactive to light.  Cardiovascular:     Rate and Rhythm: Regular rhythm.     Heart sounds: S1 normal and S2 normal. No murmur heard.  No friction rub. No gallop.   Pulmonary:     Effort: Pulmonary effort is normal. No respiratory distress.     Breath sounds: Normal breath sounds.  Chest:     Chest wall: No tenderness.  Abdominal:     General: Bowel sounds are normal.     Palpations: Abdomen is soft.     Tenderness: There is no abdominal tenderness. There is no guarding or rebound. Negative signs include Murphy's sign and McBurney's sign.     Hernia: No hernia is present.  Musculoskeletal:        General: Normal range of motion.     Cervical back: Normal range of motion and neck supple.  Skin:    General: Skin is warm and dry.     Findings: No rash.  Neurological:     Mental Status: He is alert and oriented to person, place, and time.     GCS: GCS eye subscore is 4. GCS verbal subscore is 5.  GCS motor subscore is 6.     Cranial Nerves: No cranial nerve deficit.     Sensory: No sensory deficit.     Coordination: Coordination normal.  Psychiatric:        Speech: Speech normal.        Behavior: Behavior normal.        Thought Content: Thought content normal.     ED Results / Procedures / Treatments   Labs (all labs ordered are listed, but only abnormal results are displayed) Labs Reviewed  COMPREHENSIVE METABOLIC PANEL - Abnormal; Notable for the following components:      Result Value   CO2 20 (*)    Creatinine, Ser 1.42 (*)    Total Bilirubin 1.3 (*)    All other components within normal limits  CBC WITH DIFFERENTIAL/PLATELET  D-DIMER, QUANTITATIVE (NOT AT [facility])  TROPONIN I (HIGH SENSITIVITY)  TROPONIN I (HIGH SENSITIVITY)    EKG EKG Interpretation  Date/Time:  Saturday August 01 2020 21:42:23 EDT Ventricular Rate:  92 PR Interval:  130 QRS Duration: 88 QT Interval:  382 QTC Calculation: 472 R  Axis:   31 Text Interpretation: Sinus rhythm with marked sinus arrhythmia with Premature atrial complexes with Abberant conduction Otherwise normal ECG Confirmed by Gilda Crease [phone]) on 08/01/2020 11:49:45 PM   Radiology DG Chest 2 View  Result Date: 08/01/2020 CLINICAL DATA:  Chest pain EXAM: CHEST - 2 VIEW COMPARISON:  03/02/2020 FINDINGS: The heart size and mediastinal contours are within normal limits. Both lungs are clear. The visualized skeletal structures are unremarkable. IMPRESSION: No active cardiopulmonary disease. Electronically Signed   By: Helyn Numbers MD   On: 08/01/2020 23:32    Procedures Procedures (including critical care time)  Medications Ordered in ED Medications  LORazepam (ATIVAN) tablet 1 mg (1 mg Oral Given 08/02/20 0002)    ED Course  I have reviewed the triage vital signs and the nursing notes.  Pertinent labs & imaging results that were available during my care of the patient were reviewed by me and considered in my medical decision making (see chart for details).    MDM Rules/Calculators/A&P                          Patient presents to the emergency department for evaluation of panic attack.  Patient reports onset of severe anxiety, feeling shaky, tightness in his chest and difficulty breathing after smoking ice.  Chest x-ray is unremarkable.  Lungs are clear.  Patient reports he has a previous cardiac history of something that starts with a B, records indicate history of bradycardia and PACs.  This is seen today, no ischemia or infarct on EKG. troponins are negative.  D-dimer was negative.  Patient experiencing anxiety and panic attack secondary to substance abuse.  No further intervention necessary.  Final Clinical Impression(s) / ED Diagnoses Final diagnoses:  Panic attack  Substance abuse [facility])    Rx / DC Orders ED Discharge Orders    None       Amethyst Gainer, Canary Brim, MD 08/02/20 [phone]

## 2020-08-02 ENCOUNTER — Emergency Department (HOSPITAL_COMMUNITY)
Admission: EM | Admit: 2020-08-02 | Discharge: 2020-08-03 | Disposition: A | Discharge status: Home / Self Care | Payer: Self-pay | Attending: Emergency Medicine | Admitting: Emergency Medicine

## 2020-08-02 ENCOUNTER — Other Ambulatory Visit: Payer: Self-pay

## 2020-08-02 DIAGNOSIS — I10 Essential (primary) hypertension: Secondary | ICD-10-CM | POA: Insufficient documentation

## 2020-08-02 DIAGNOSIS — J45909 Unspecified asthma, uncomplicated: Secondary | ICD-10-CM | POA: Insufficient documentation

## 2020-08-02 DIAGNOSIS — T50901A Poisoning by unspecified drugs, medicaments and biological substances, accidental (unintentional), initial encounter: Secondary | ICD-10-CM

## 2020-08-02 DIAGNOSIS — R4689 Other symptoms and signs involving appearance and behavior: Secondary | ICD-10-CM

## 2020-08-02 DIAGNOSIS — F17210 Nicotine dependence, cigarettes, uncomplicated: Secondary | ICD-10-CM | POA: Insufficient documentation

## 2020-08-02 DIAGNOSIS — S60811A Abrasion of right wrist, initial encounter: Secondary | ICD-10-CM | POA: Insufficient documentation

## 2020-08-02 DIAGNOSIS — X58XXXA Exposure to other specified factors, initial encounter: Secondary | ICD-10-CM | POA: Insufficient documentation

## 2020-08-02 DIAGNOSIS — S60511A Abrasion of right hand, initial encounter: Secondary | ICD-10-CM | POA: Insufficient documentation

## 2020-08-02 DIAGNOSIS — R000 Tachycardia, unspecified: Secondary | ICD-10-CM | POA: Insufficient documentation

## 2020-08-02 DIAGNOSIS — R4182 Altered mental status, unspecified: Secondary | ICD-10-CM | POA: Insufficient documentation

## 2020-08-02 LAB — COMPREHENSIVE METABOLIC PANEL
ALT: 17 U/L (ref 0–44)
AST: 30 U/L (ref 15–41)
Albumin: 4.6 g/dL (ref 3.5–5.0)
Alkaline Phosphatase: 42 U/L (ref 38–126)
Anion gap: 15 (ref 5–15)
BUN: 11 mg/dL (ref 6–20)
CO2: 20 mmol/L — ABNORMAL LOW (ref 22–32)
Calcium: 9.3 mg/dL (ref 8.9–10.3)
Chloride: 101 mmol/L (ref 98–111)
Creatinine, Ser: 1.42 mg/dL — ABNORMAL HIGH (ref 0.61–1.24)
GFR calc Af Amer: >60 mL/min (ref >60)
GFR calc non Af Amer: >60 mL/min (ref >60)
Glucose, Bld: 91 mg/dL (ref 70–99)
Potassium: 3.7 mmol/L (ref 3.5–5.1)
Sodium: 136 mmol/L (ref 135–145)
Total Bilirubin: 1.3 mg/dL — ABNORMAL HIGH (ref 0.3–1.2)
Total Protein: 7.3 g/dL (ref 6.5–8.1)

## 2020-08-02 LAB — TROPONIN I (HIGH SENSITIVITY)
Troponin I (High Sensitivity): 5 ng/L (ref <18)
Troponin I (High Sensitivity): 5 ng/L (ref <18)

## 2020-08-02 LAB — D-DIMER, QUANTITATIVE: D-Dimer, Quant: 0.4 ug/mL-FEU (ref 0.00–0.50)

## 2020-08-02 NOTE — ED Notes (Signed)
Pt placed on 6L of O2 via [place] due to saturations dropping to 84% on RA. Sats up to 95%
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
## 2020-08-03 ENCOUNTER — Emergency Department (HOSPITAL_COMMUNITY)
Admission: EM | Admit: 2020-08-03 | Discharge: 2020-08-03 | Disposition: A | Discharge status: Home / Self Care | Payer: Self-pay | Attending: Emergency Medicine | Admitting: Emergency Medicine

## 2020-08-03 ENCOUNTER — Other Ambulatory Visit: Payer: Self-pay

## 2020-08-03 ENCOUNTER — Encounter (HOSPITAL_COMMUNITY): Payer: Self-pay

## 2020-08-03 ENCOUNTER — Emergency Department (HOSPITAL_COMMUNITY): Payer: Self-pay

## 2020-08-03 DIAGNOSIS — W010XXA Fall on same level from slipping, tripping and stumbling without subsequent striking against object, initial encounter: Secondary | ICD-10-CM | POA: Insufficient documentation

## 2020-08-03 DIAGNOSIS — S50812A Abrasion of left forearm, initial encounter: Secondary | ICD-10-CM | POA: Insufficient documentation

## 2020-08-03 DIAGNOSIS — I10 Essential (primary) hypertension: Secondary | ICD-10-CM | POA: Insufficient documentation

## 2020-08-03 DIAGNOSIS — R0781 Pleurodynia: Secondary | ICD-10-CM | POA: Insufficient documentation

## 2020-08-03 DIAGNOSIS — F1910 Other psychoactive substance abuse, uncomplicated: Secondary | ICD-10-CM | POA: Insufficient documentation

## 2020-08-03 DIAGNOSIS — W19XXXA Unspecified fall, initial encounter: Secondary | ICD-10-CM

## 2020-08-03 DIAGNOSIS — M545 Low back pain: Secondary | ICD-10-CM | POA: Insufficient documentation

## 2020-08-03 DIAGNOSIS — F17210 Nicotine dependence, cigarettes, uncomplicated: Secondary | ICD-10-CM | POA: Insufficient documentation

## 2020-08-03 DIAGNOSIS — R079 Chest pain, unspecified: Secondary | ICD-10-CM | POA: Insufficient documentation

## 2020-08-03 DIAGNOSIS — J45909 Unspecified asthma, uncomplicated: Secondary | ICD-10-CM | POA: Insufficient documentation

## 2020-08-03 DIAGNOSIS — S50811A Abrasion of right forearm, initial encounter: Secondary | ICD-10-CM | POA: Insufficient documentation

## 2020-08-03 MED ORDER — KETOROLAC TROMETHAMINE 60 MG/2ML IM SOLN
30.0000 mg | Freq: Once | INTRAMUSCULAR | Status: AC
  Administered 2020-08-03: 30 mg via INTRAMUSCULAR
  Filled 2020-08-03: qty 2

## 2020-08-03 NOTE — ED Notes (Signed)
Attempted to give pt his discharge ppw and pt states "get that shit out of my face".
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
## 2020-08-03 NOTE — ED Notes (Signed)
Was able to arouse pt easily. Asked pt if he could get up and walk for me, and he said "no, I am not going to get up and walk". I explained to him that he cannot stay due to beds needed for other pts. Pt is refusing to cooperate. Provider notified.
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
## 2020-08-03 NOTE — Discharge Instructions (Addendum)
Follow-up with your primary doctor regarding fall and substance abuse. See attached for local resources to help with your substance abuse.

## 2020-08-03 NOTE — ED Notes (Signed)
Security called to escort pt off premises due to pt refusal to cooperate

## 2020-10-06 IMAGING — CR DG CHEST 2V
2 series · 2 of 2 positions shown · non-contrast
Comparison: August 01, 2020

CLINICAL DATA: Status post trauma.

EXAM:
CHEST - 2 VIEW

[w chest pa]
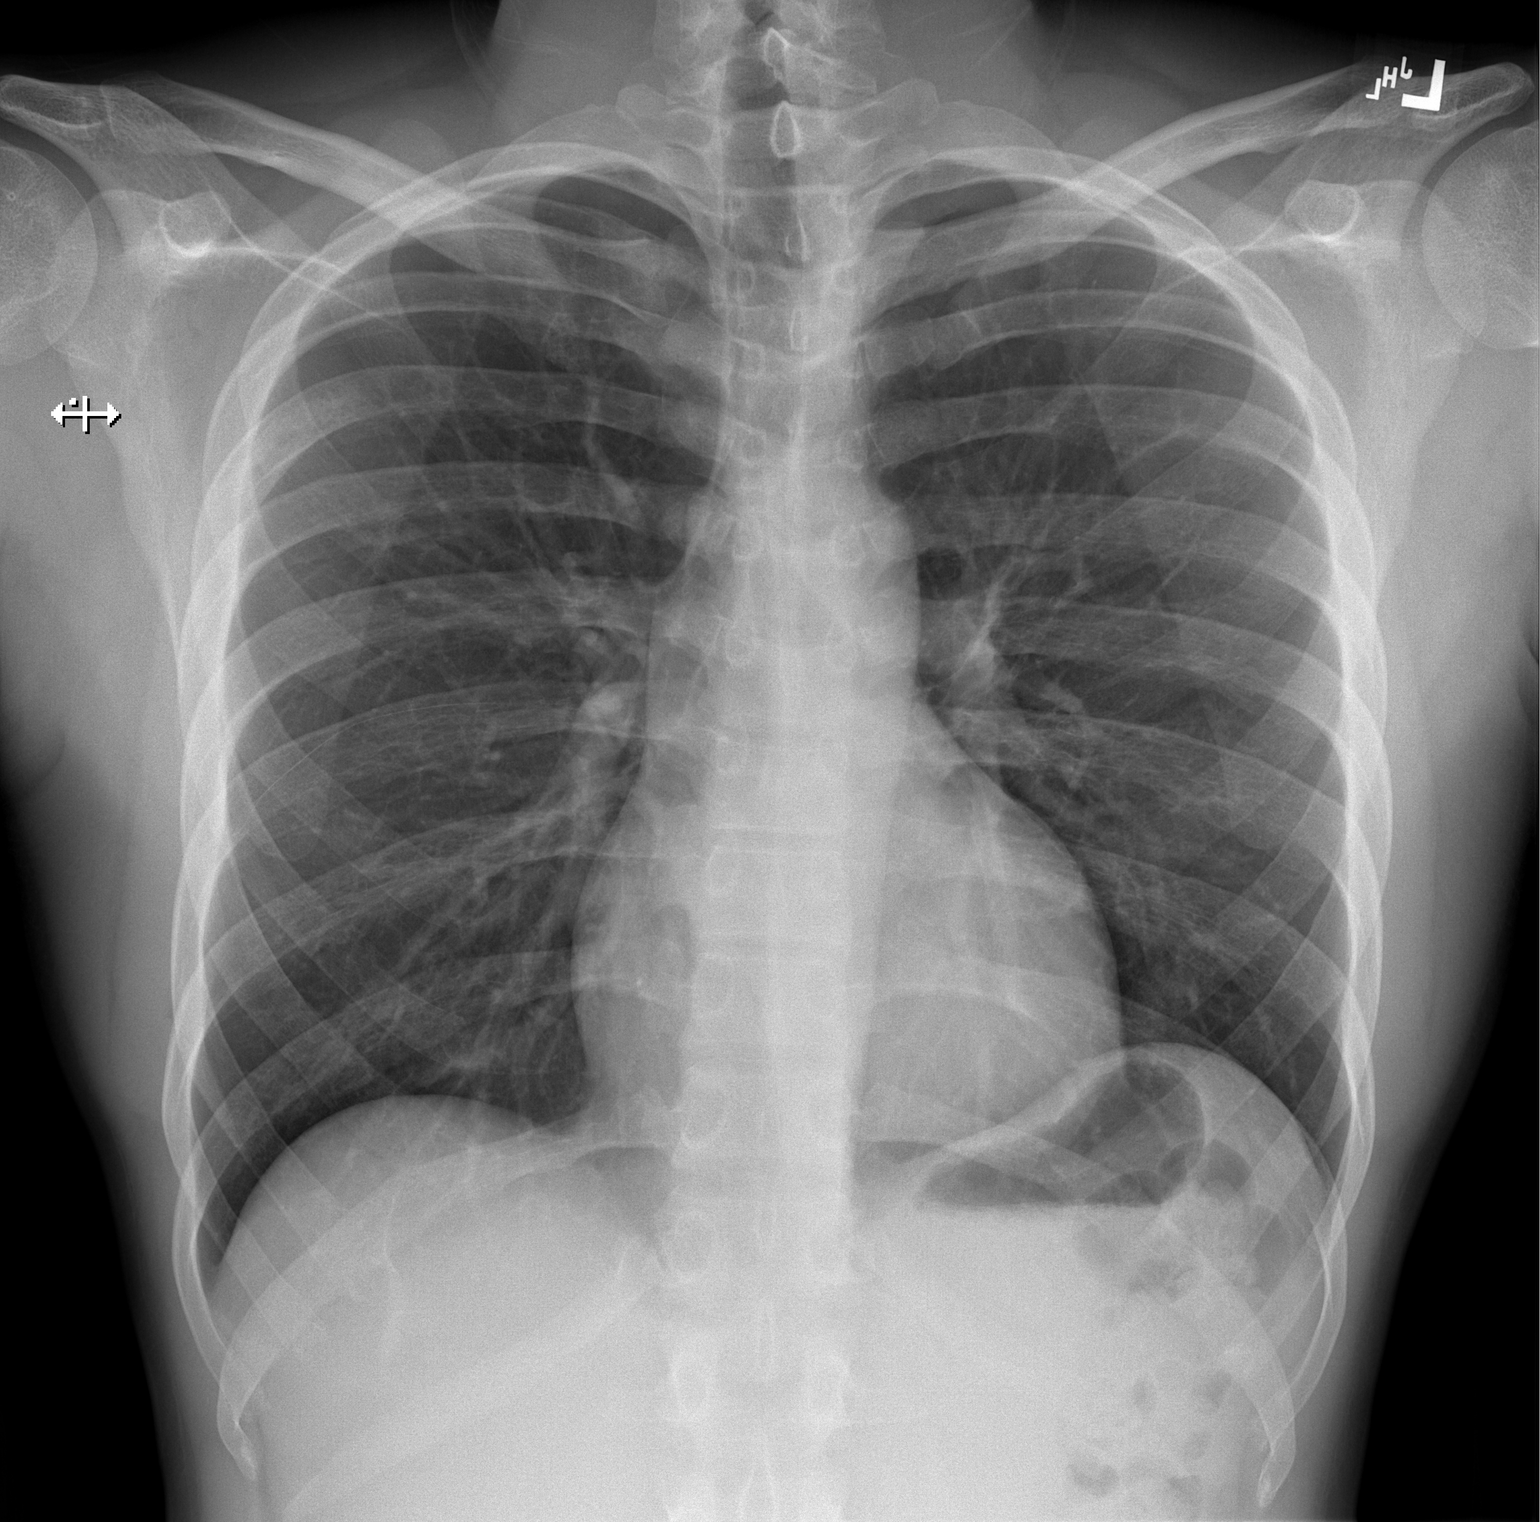

[w chest lat]
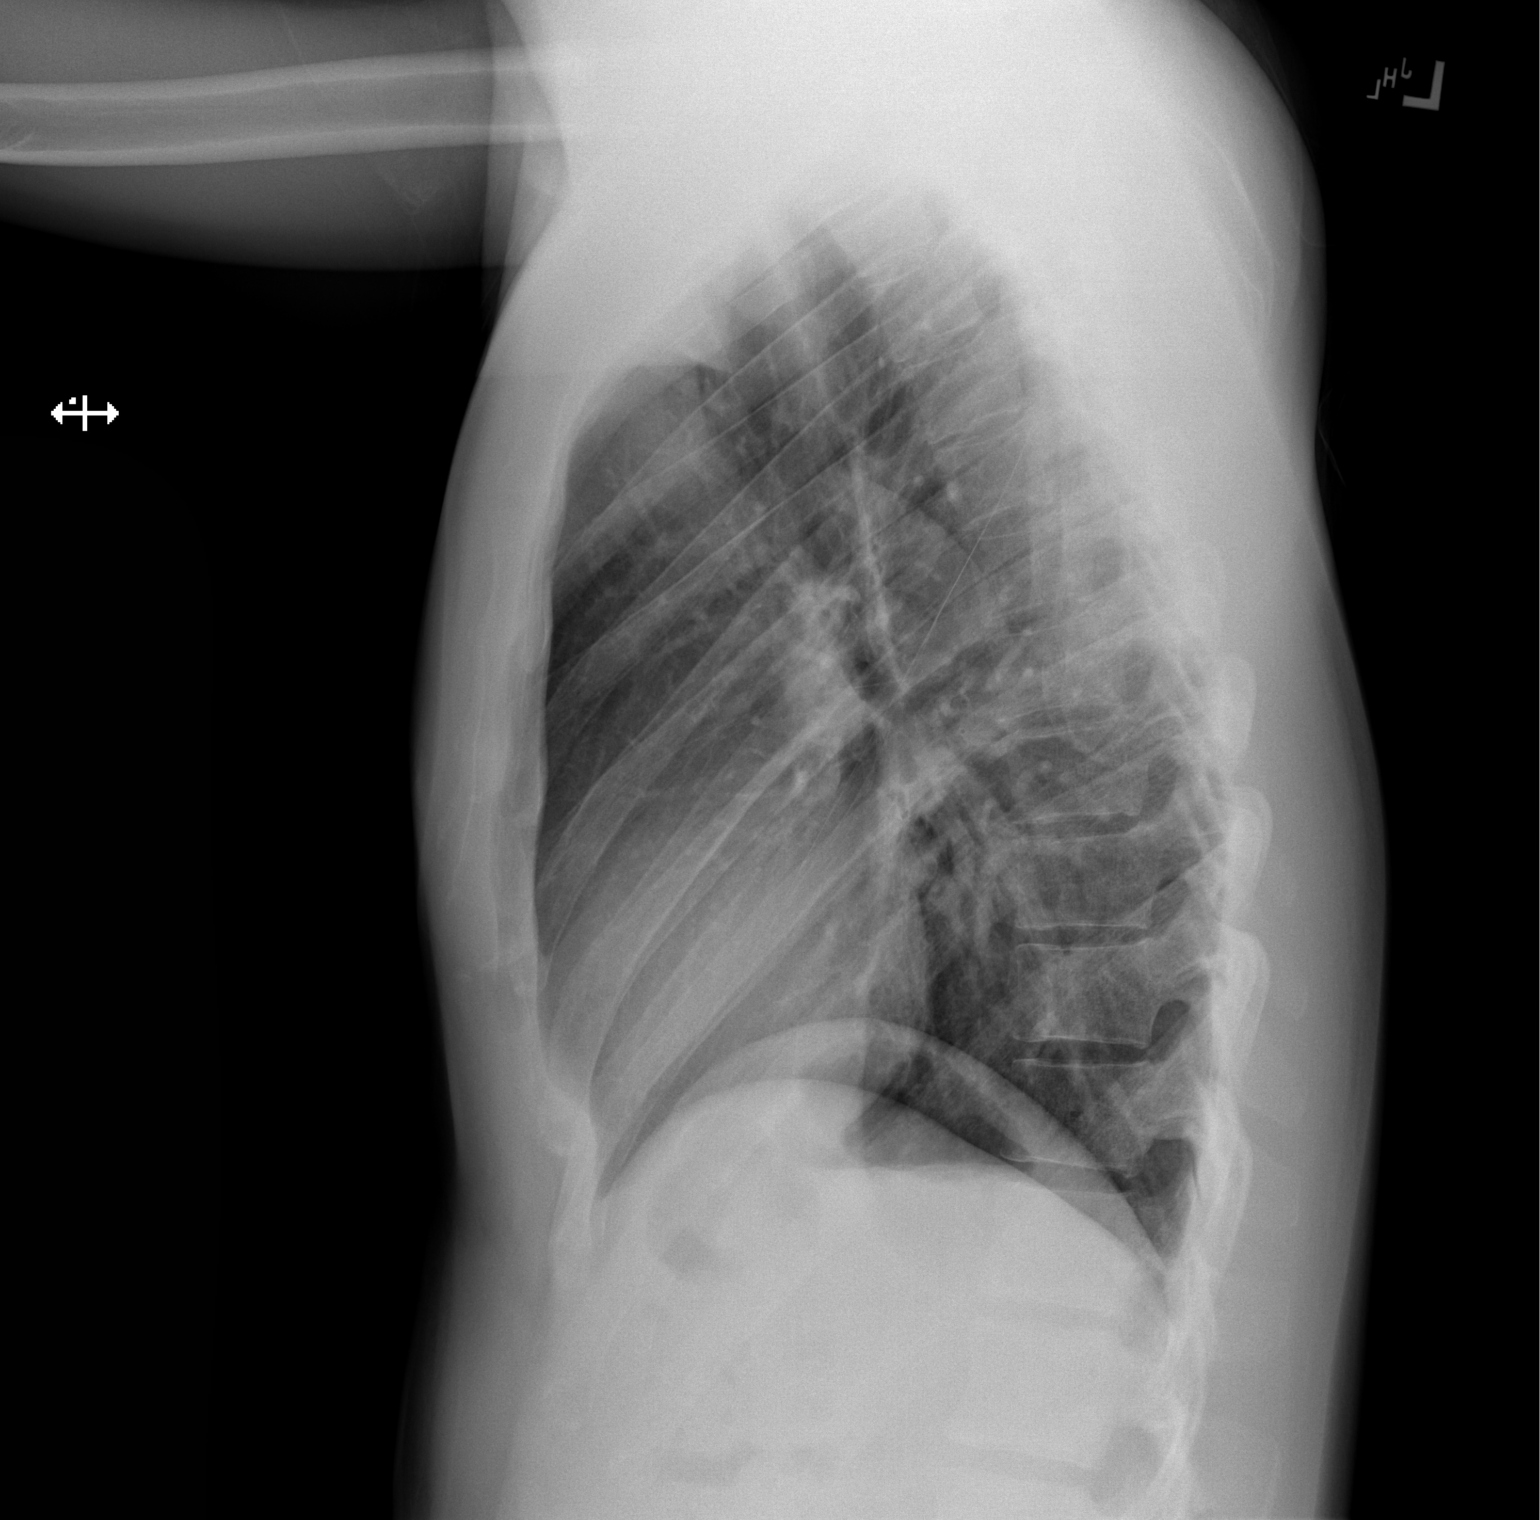

[2 of 2 positions shown; findings below may reference images not displayed]

FINDINGS: The heart size and mediastinal contours are within normal limits.
Both lungs are clear. The visualized skeletal structures are
unremarkable.
IMPRESSION: No active cardiopulmonary disease.
